# Patient Record
Sex: Male | Born: 1985
Health system: Southern US, Community
[De-identification: ages and names within clinical notes are randomized; demographics above are authoritative.]

## PROBLEM LIST (undated history)

## (undated) DIAGNOSIS — N200 Calculus of kidney: Secondary | ICD-10-CM

## (undated) DIAGNOSIS — B019 Varicella without complication: Secondary | ICD-10-CM

## (undated) DIAGNOSIS — G4733 Obstructive sleep apnea (adult) (pediatric): Secondary | ICD-10-CM

## (undated) DIAGNOSIS — R569 Unspecified convulsions: Secondary | ICD-10-CM

## (undated) HISTORY — PX: TONSILLECTOMY AND ADENOIDECTOMY: SHX28

## (undated) HISTORY — DX: Calculus of kidney: N20.0

## (undated) HISTORY — DX: Varicella without complication: B01.9

## (undated) HISTORY — DX: Obstructive sleep apnea (adult) (pediatric): G47.33

## (undated) HISTORY — PX: VASECTOMY: SHX75

---

## 2016-05-10 ENCOUNTER — Emergency Department
Admission: EM | Admit: 2016-05-10 | Discharge: 2016-05-10 | Disposition: A | Discharge status: Home / Self Care | Payer: Commercial Managed Care - PPO | Attending: Emergency Medicine | Admitting: Emergency Medicine

## 2016-05-10 DIAGNOSIS — S61012A Laceration without foreign body of left thumb without damage to nail, initial encounter: Secondary | ICD-10-CM | POA: Insufficient documentation

## 2016-05-10 DIAGNOSIS — W228XXA Striking against or struck by other objects, initial encounter: Secondary | ICD-10-CM | POA: Diagnosis not present

## 2016-05-10 DIAGNOSIS — Y9259 Other trade areas as the place of occurrence of the external cause: Secondary | ICD-10-CM | POA: Diagnosis not present

## 2016-05-10 DIAGNOSIS — Y999 Unspecified external cause status: Secondary | ICD-10-CM | POA: Diagnosis not present

## 2016-05-10 DIAGNOSIS — Y9389 Activity, other specified: Secondary | ICD-10-CM | POA: Insufficient documentation

## 2016-05-10 DIAGNOSIS — Z23 Encounter for immunization: Secondary | ICD-10-CM | POA: Diagnosis not present

## 2016-05-10 DIAGNOSIS — S61412A Laceration without foreign body of left hand, initial encounter: Secondary | ICD-10-CM

## 2016-05-10 MED ORDER — LIDOCAINE HCL (PF) 1 % IJ SOLN
INTRAMUSCULAR | Status: AC
  Filled 2016-05-10: qty 5

## 2016-05-10 MED ORDER — TETANUS-DIPHTH-ACELL PERTUSSIS 5-2.5-18.5 LF-MCG/0.5 IM SUSP
0.5000 mL | Freq: Once | INTRAMUSCULAR | Status: AC
  Administered 2016-05-10: 0.5 mL via INTRAMUSCULAR

## 2016-05-10 NOTE — ED Provider Notes (Signed)
[facility] Emergency Department Provider Note  ____________________________________________  Time seen: Approximately 7:38 PM  I have reviewed the triage vital signs and the nursing notes.   HISTORY  Chief Complaint Extremity Laceration    HPI Calvin Mcbride is a 31 y.o. male that presents to the emergency with left hand laceration. Patient was working in his garage when piece of metal hit the back of his first knuckle. Patient is not having any difficulty moving fingers or wrist. Patient denies sensation changes. Patient has not taken anything for pain. Patient is not sure when his last tetanus shot was. Patient denies any additional injuries.   No past medical history on file.  There are no active problems to display for this patient.   No past surgical history on file.  Prior to Admission medications   Not on File    Allergies Patient has no known allergies.  No family history on file.  Social History Social History  Substance Use Topics  . Smoking status: Not on file  . Smokeless tobacco: Not on file  . Alcohol use Not on file     Review of Systems  Constitutional: No fever/chills ENT: No upper respiratory complaints. Cardiovascular: No chest pain. Respiratory: No SOB. Gastrointestinal: No abdominal pain.  No nausea, no vomiting.  Musculoskeletal: Negative for musculoskeletal pain. Skin: Negative for ecchymosis. Neurological: Negative for headaches, numbness or tingling   ____________________________________________   PHYSICAL EXAM:  VITAL SIGNS: ED Triage Vitals [05/10/16 1816]  Enc Vitals Group     BP (!) 156/105     Pulse Rate 67     Resp 19     Temp 98 F (36.7 C)     Temp Source Oral     SpO2 99 %     Weight 220 lb (99.8 kg)     Height 5\' 11"  (1.803 m)     Head Circumference      Peak Flow      Pain Score 3     Pain Loc      Pain Edu?      Excl. in GC?      Constitutional: Alert and oriented. Well  appearing and in no acute distress. Eyes: Conjunctivae are normal. PERRL. EOMI. Head: Atraumatic. ENT:      Ears:      Nose: No congestion/rhinnorhea.      Mouth/Throat: Mucous membranes are moist.  Neck: No stridor.   Cardiovascular: Normal rate, regular rhythm. Normal S1 and S2.  Good peripheral circulation. 2+ radial pulses. Respiratory: Normal respiratory effort without tachypnea or retractions. Lungs CTAB. Good air entry to the bases with no decreased or absent breath sounds. Musculoskeletal: Full range of motion to all extremities. No gross deformities appreciated. Neurologic:  Normal speech and language. No gross focal neurologic deficits are appreciated.  Skin:  Skin is warm, dry. No rash noted. 3 cm laceration over first knuckle. Bleeding controlled. Psychiatric: Mood and affect are normal. Speech and behavior are normal. Patient exhibits appropriate insight and judgement.   ____________________________________________   LABS (all labs ordered are listed, but only abnormal results are displayed)  Labs Reviewed - No data to display ____________________________________________  EKG   ____________________________________________  RADIOLOGY No results found.  ____________________________________________    PROCEDURES  Procedure(s) performed:    Procedures  LACERATION REPAIR Performed by: Enid DerryAshley Ashey Tramontana  Consent: Verbal consent obtained.  Consent given by: patient  Prepped and Draped in normal sterile fashion  Wound explored: No foreign bodies  Laceration Location: 1st metacarpal  Laceration Length: 3 cm  Anesthesia: None  Local anesthetic: lidocaine 1% without epinephrine  Anesthetic total: 2 ml  Irrigation method: syringe  Amount of cleaning: normal saline  Skin closure: 4-0 nylon  Number of sutures: 8  Technique: Simple interrupted  Patient tolerance: Patient tolerated the procedure well with no immediate  complications.  Medications  lidocaine (PF) (XYLOCAINE) 1 % injection (not administered)  Tdap (BOOSTRIX) injection 0.5 mL (0.5 mLs Intramuscular Given 05/10/16 1950)     ____________________________________________   INITIAL IMPRESSION / ASSESSMENT AND PLAN / ED COURSE  Pertinent labs & imaging results that were available during my care of the patient were reviewed by me and considered in my medical decision making (see chart for details).  Review of the [place] CSRS was performed in accordance of the [facility] prior to dispensing any controlled drugs.  Clinical Course     Patient's diagnosis is consistent with laceration. Laceration was repaired with 8 stitches. Tetanus shot was updated. Patient is to follow up with PCP as directed. Patient is given ED precautions to return to the ED for any worsening or new symptoms.     ____________________________________________  FINAL CLINICAL IMPRESSION(S) / ED DIAGNOSES  Final diagnoses:  Laceration of left hand without foreign body, initial encounter      NEW MEDICATIONS STARTED DURING THIS VISIT:  New Prescriptions   No medications on file        This chart was dictated using voice recognition software/Dragon. Despite best efforts to proofread, errors can occur which can change the meaning. Any change was purely unintentional.    Enid Derry, PA-C 05/10/16 2028    Minna Antis, MD 05/10/16 2300

## 2016-05-10 NOTE — ED Triage Notes (Signed)
Pt reports laceration to left hand on piece of sheet metal about 45 min PTA; bleeding controlled at this time. Pt unsure of last tetanus.

## 2016-05-10 NOTE — ED Notes (Signed)
See triage note   States he was working on [employer], [employer]age door    Piece of metal shot back  Large flap laceration noted to top of left hand

## 2016-11-07 ENCOUNTER — Telehealth: Payer: Self-pay | Admitting: Family

## 2016-11-07 ENCOUNTER — Ambulatory Visit (INDEPENDENT_AMBULATORY_CARE_PROVIDER_SITE_OTHER): Payer: Commercial Managed Care - PPO | Admitting: Family

## 2016-11-07 ENCOUNTER — Other Ambulatory Visit: Payer: Self-pay | Admitting: Family

## 2016-11-07 ENCOUNTER — Encounter: Payer: Self-pay | Admitting: Family

## 2016-11-07 VITALS — BP 136/88 | HR 68 | Temp 98.2°F | Ht 71.0 in | Wt 229.8 lb

## 2016-11-07 DIAGNOSIS — Z0000 Encounter for general adult medical examination without abnormal findings: Secondary | ICD-10-CM

## 2016-11-07 DIAGNOSIS — L989 Disorder of the skin and subcutaneous tissue, unspecified: Secondary | ICD-10-CM | POA: Diagnosis not present

## 2016-11-07 DIAGNOSIS — R0683 Snoring: Secondary | ICD-10-CM | POA: Diagnosis not present

## 2016-11-07 DIAGNOSIS — Z0001 Encounter for general adult medical examination with abnormal findings: Secondary | ICD-10-CM

## 2016-11-07 DIAGNOSIS — J301 Allergic rhinitis due to pollen: Secondary | ICD-10-CM | POA: Diagnosis not present

## 2016-11-07 LAB — COMPREHENSIVE METABOLIC PANEL
ALT: 31 U/L (ref 0–53)
AST: 19 U/L (ref 0–37)
Albumin: 4.6 g/dL (ref 3.5–5.2)
Alkaline Phosphatase: 54 U/L (ref 39–117)
BUN: 20 mg/dL (ref 6–23)
CO2: 28 mEq/L (ref 19–32)
Calcium: 9.9 mg/dL (ref 8.4–10.5)
Chloride: 103 mEq/L (ref 96–112)
Creatinine, Ser: 0.96 mg/dL (ref 0.40–1.50)
GFR: 95.91 mL/min (ref >60.00)
Glucose, Bld: 100 mg/dL — ABNORMAL HIGH (ref 70–99)
Potassium: 4.2 mEq/L (ref 3.5–5.1)
Sodium: 141 mEq/L (ref 135–145)
Total Bilirubin: 0.4 mg/dL (ref 0.2–1.2)
Total Protein: 7.8 g/dL (ref 6.0–8.3)

## 2016-11-07 LAB — CBC WITH DIFFERENTIAL/PLATELET
Basophils Absolute: 0.1 10*3/uL (ref 0.0–0.1)
Basophils Relative: 0.9 % (ref 0.0–3.0)
Eosinophils Absolute: 0.5 10*3/uL (ref 0.0–0.7)
Eosinophils Relative: 6 % — ABNORMAL HIGH (ref 0.0–5.0)
HCT: 46.2 % (ref 39.0–52.0)
Hemoglobin: 15.7 g/dL (ref 13.0–17.0)
Lymphocytes Relative: 33.1 % (ref 12.0–46.0)
Lymphs Abs: 2.7 10*3/uL (ref 0.7–4.0)
MCHC: 33.9 g/dL (ref 30.0–36.0)
MCV: 85.8 fl (ref 78.0–100.0)
Monocytes Absolute: 0.8 10*3/uL (ref 0.1–1.0)
Monocytes Relative: 10.3 % (ref 3.0–12.0)
Neutro Abs: 4.1 10*3/uL (ref 1.4–7.7)
Neutrophils Relative %: 49.7 % (ref 43.0–77.0)
Platelets: 269 10*3/uL (ref 150.0–400.0)
RBC: 5.39 Mil/uL (ref 4.22–5.81)
RDW: 14.1 % (ref 11.5–15.5)
WBC: 8.2 10*3/uL (ref 4.0–10.5)

## 2016-11-07 LAB — LIPID PANEL
Cholesterol: 189 mg/dL (ref 0–200)
HDL: 40.1 mg/dL (ref >39.00)
LDL Cholesterol: 122 mg/dL — ABNORMAL HIGH (ref 0–99)
NonHDL: 148.42
Total CHOL/HDL Ratio: 5
Triglycerides: 130 mg/dL (ref 0.0–149.0)
VLDL: 26 mg/dL (ref 0.0–40.0)

## 2016-11-07 LAB — TSH: TSH: 2.53 u[IU]/mL (ref 0.35–4.50)

## 2016-11-07 LAB — VITAMIN D 25 HYDROXY (VIT D DEFICIENCY, FRACTURES): VITD: 27.93 ng/mL — ABNORMAL LOW (ref 30.00–100.00)

## 2016-11-07 LAB — HEMOGLOBIN A1C: Hgb A1c MFr Bld: 5.5 % (ref 4.6–6.5)

## 2016-11-07 MED ORDER — FLUTICASONE PROPIONATE 50 MCG/ACT NA SUSP: 2.0000 | Freq: Every day | NASAL | 3 refills | Status: DC

## 2016-11-07 NOTE — Telephone Encounter (Signed)
close

## 2016-11-07 NOTE — Progress Notes (Signed)
Pre visit review using our clinic review tool, if applicable. No additional management support is needed unless otherwise documented below in the visit note. 

## 2016-11-07 NOTE — Assessment & Plan Note (Signed)
Up-to-date on immunizations. Testicular exam performed today. No early family history colon cancer. Screening labs ordered including HIV consented for. Encouraged exercise.

## 2016-11-07 NOTE — Progress Notes (Signed)
Subjective:    Patient ID: Calvin Mcbride, male    DOB: 10/07/1985, 31 y.o.   MRN: [id number]  CC: Calvin Mcbride is a 31 y.o. male who presents today to establish care.    HPI: Would like to establish care today and fasting labs for work to be completed for health screen.   Spots on penis, 1.5 year half. Not changing, growing. No painful.  No new sexual partners. No urinary problems, hesitancy.   Also complains of chronic allergies and post nasal drip since [place]. Has tried over-the-counter antihistamines with no consistent relief. No fever, Nasal congestion  Reports that he snores and has concerns for sleep apnea. No fatigue  Non smoker.   Occasional alcohol.   No formal exercise.   Skin: follows with dermatology, seen last year.   Tdap UTD                   HISTORY:  Past Medical History:  Diagnosis Date  . Chicken pox   . Kidney stones    Past Surgical History:  Procedure Laterality Date  . TONSILLECTOMY AND ADENOIDECTOMY     Family History  Problem Relation Age of Onset  . Colon cancer Neg Hx   . Prostate cancer Neg Hx     Allergies: Patient has no known allergies. No current outpatient prescriptions on file prior to visit.   No current facility-administered medications on file prior to visit.     Social History  Substance Use Topics  . Smoking status: Never Smoker  . Smokeless tobacco: Never Used  . Alcohol use Yes     Comment: Occasional    Review of Systems  Constitutional: Negative for chills and fever.  HENT: Negative for congestion.   Respiratory: Negative for cough.   Cardiovascular: Negative for chest pain, palpitations and leg swelling.  Gastrointestinal: Negative for diarrhea, nausea and vomiting.  Genitourinary: Positive for genital sores. Negative for difficulty urinating, discharge, dysuria, penile pain, penile swelling, scrotal swelling and testicular pain.  Musculoskeletal: Negative for  myalgias.  Skin: Negative for rash.  Neurological: Negative for headaches.  Hematological: Negative for adenopathy.  Psychiatric/Behavioral: Negative for confusion.      Objective:    BP 136/88   Pulse 68   Temp 98.2 F (36.8 C) (Oral)   Ht 5\' 11"  (1.803 m)   Wt 229 lb 12.8 oz (104.2 kg)   SpO2 98%   BMI 32.05 kg/m  BP Readings from Last 3 Encounters:  11/07/16 136/88  05/10/16 139/83   Wt Readings from Last 3 Encounters:  11/07/16 229 lb 12.8 oz (104.2 kg)  05/10/16 220 lb (99.8 kg)    Physical Exam  Constitutional: He appears well-developed and well-nourished.  Neck: No thyroid mass and no thyromegaly present.  Cardiovascular: Regular rhythm and normal heart sounds.   Pulmonary/Chest: Effort normal and breath sounds normal. No respiratory distress. He has no wheezes. He has no rhonchi. He has no rales.  Genitourinary: Testes normal and penis normal. Cremasteric reflex is present. Right testis shows no mass, no swelling and no tenderness. Left testis shows no mass, no swelling and no tenderness. Circumcised.  Genitourinary Comments: Multiple flat dome shaped lesions noted as on diagram, hyperpigmented  Lymphadenopathy:       Head (right side): No submental, no submandibular, no tonsillar, no preauricular, no posterior auricular and no occipital adenopathy present.       Head (left side): No submental, no submandibular, no tonsillar, no preauricular, no  posterior auricular and no occipital adenopathy present.    He has no cervical adenopathy.    He has no axillary adenopathy.  Neurological: He is alert.  Skin: Skin is warm and dry.  Psychiatric: He has a normal mood and affect. His speech is normal and behavior is normal.  Vitals reviewed.      Assessment & Plan:   Problem List Items Addressed This Visit      Respiratory   Allergic rhinitis due to pollen    Chronic. Advised Flonase, nasal saline wash. Offered allergy referral and patient politely declined today.  Return precautions given.      Relevant Medications   fluticasone (FLONASE) 50 MCG/ACT nasal spray     Musculoskeletal and Integument   Skin lesion    Etiology nonspecific at this time. Considering condylomata acuminata however this should be differentiated from cancer and formal diagnosis made which I explained to patient. recommended make an appointment with dermatology  for biopsy. Pending lab studies for HSV.       Relevant Orders   RPR   HSV(herpes simplex vrs) 1+2 ab-IgM     Other   Routine physical examination - Primary    Up-to-date on immunizations. Testicular exam performed today. No early family history colon cancer. Screening labs ordered including HIV consented for. Encouraged exercise.      Relevant Orders   CBC with Differential/Platelet   Comprehensive metabolic panel   Lipid panel   Hemoglobin A1c   VITAMIN D 25 Hydroxy (Vit-D Deficiency, Fractures)   TSH   HIV antibody (with reflex)   Snores    Sleep study ordered.      Relevant Orders   Ambulatory referral to Sleep Studies       I am having Mr. Roam start on fluticasone.   Meds ordered this encounter  Medications  . fluticasone (FLONASE) 50 MCG/ACT nasal spray    Sig: Place 2 sprays into both nostrils daily.    Dispense:  16 g    Refill:  3    Order Specific Question:   Supervising Provider    Answer:   Sherlene Shams [[id number]]    Return precautions given.   Risks, benefits, and alternatives of the medications and treatment plan prescribed today were discussed, and patient expressed understanding.   Education regarding symptom management and diagnosis given to patient on AVS.  Continue to follow with Allegra Grana, FNP for routine health maintenance.   Calvin Horns and I agreed with plan.   Rennie Plowman, FNP

## 2016-11-07 NOTE — Assessment & Plan Note (Signed)
Etiology nonspecific at this time. Considering condylomata acuminata however this should be differentiated from cancer and formal diagnosis made which I explained to patient. recommended make an appointment with dermatology  for biopsy. Pending lab studies for HSV.

## 2016-11-07 NOTE — Assessment & Plan Note (Addendum)
Snores. Elevated blood pressure. Sleep study ordered.

## 2016-11-07 NOTE — Assessment & Plan Note (Signed)
Chronic. Advised Flonase, nasal saline wash. Offered allergy referral and patient politely declined today. Return precautions given.

## 2016-11-07 NOTE — Patient Instructions (Addendum)
Pleasure meeting you  Labs today  Let me know if flonase doesn't help  Lesions on penis, I would recommend make an appointment with dermatology sig need biopsy. It may be condylomata acuminata ( genital warts) however this should be differentiated and formal diagnosis made.     Health Maintenance, Male A healthy lifestyle and preventive care is important for your health and wellness. Ask your health care provider about what schedule of regular examinations is right for you. What should I know about weight and diet? Eat a Healthy Diet  Eat plenty of vegetables, fruits, whole grains, low-fat dairy products, and lean protein.  Do not eat a lot of foods high in solid fats, added sugars, or salt.  Maintain a Healthy Weight Regular exercise can help you achieve or maintain a healthy weight. You should:  Do at least 150 minutes of exercise each week. The exercise should increase your heart rate and make you sweat (moderate-intensity exercise).  Do strength-training exercises at least twice a week.  Watch Your Levels of Cholesterol and Blood Lipids  Have your blood tested for lipids and cholesterol every 5 years starting at 33 years of age. If you are at high risk for heart disease, you should start having your blood tested when you are 33 years old. You may need to have your cholesterol levels checked more often if: ? Your lipid or cholesterol levels are high. ? You are older than 33 years of age. ? You are at high risk for heart disease.  What should I know about cancer screening? Many types of cancers can be detected early and may often be prevented. Lung Cancer  You should be screened every year for lung cancer if: ? You are a current smoker who has smoked for at least 30 years. ? You are a former smoker who has quit within the past 15 years.  Talk to your health care provider about your screening options, when you should start screening, and how often you should be  screened.  Colorectal Cancer  Routine colorectal cancer screening usually begins at 33 years of age and should be repeated every 5-10 years until you are 33 years old. You may need to be screened more often if early forms of precancerous polyps or small growths are found. Your health care provider may recommend screening at an earlier age if you have risk factors for colon cancer.  Your health care provider may recommend using home test kits to check for hidden blood in the stool.  A small camera at the end of a tube can be used to examine your colon (sigmoidoscopy or colonoscopy). This checks for the earliest forms of colorectal cancer.  Prostate and Testicular Cancer  Depending on your age and overall health, your health care provider may do certain tests to screen for prostate and testicular cancer.  Talk to your health care provider about any symptoms or concerns you have about testicular or prostate cancer.  Skin Cancer  Check your skin from head to toe regularly.  Tell your health care provider about any new moles or changes in moles, especially if: ? There is a change in a mole's size, shape, or color. ? You have a mole that is larger than a pencil eraser.  Always use sunscreen. Apply sunscreen liberally and repeat throughout the day.  Protect yourself by wearing long sleeves, pants, a wide-brimmed hat, and sunglasses when outside.  What should I know about heart disease, diabetes, and high blood pressure?  If you are 74-45 years of age, have your blood pressure checked every 3-5 years. If you are 54 years of age or older, have your blood pressure checked every year. You should have your blood pressure measured twice-once when you are at a hospital or clinic, and once when you are not at a hospital or clinic. Record the average of the two measurements. To check your blood pressure when you are not at a hospital or clinic, you can use: ? An automated blood pressure machine at a  pharmacy. ? A home blood pressure monitor.  Talk to your health care provider about your target blood pressure.  If you are between 21-45 years old, ask your health care provider if you should take aspirin to prevent heart disease.  Have regular diabetes screenings by checking your fasting blood sugar level. ? If you are at a normal weight and have a low risk for diabetes, have this test once every three years after the age of 31. ? If you are overweight and have a high risk for diabetes, consider being tested at a younger age or more often.  A one-time screening for abdominal aortic aneurysm (AAA) by ultrasound is recommended for men aged 36-75 years who are current or former smokers. What should I know about preventing infection? Hepatitis B If you have a higher risk for hepatitis B, you should be screened for this virus. Talk with your health care provider to find out if you are at risk for hepatitis B infection. Hepatitis C Blood testing is recommended for:  Everyone born from [birth year] through 1967.  Anyone with known risk factors for hepatitis C.  Sexually Transmitted Diseases (STDs)  You should be screened each year for STDs including gonorrhea and chlamydia if: ? You are sexually active and are younger than 33 years of age. ? You are older than 33 years of age and your health care provider tells you that you are at risk for this type of infection. ? Your sexual activity has changed since you were last screened and you are at an increased risk for chlamydia or gonorrhea. Ask your health care provider if you are at risk.  Talk with your health care provider about whether you are at high risk of being infected with HIV. Your health care provider may recommend a prescription medicine to help prevent HIV infection.  What else can I do?  Schedule regular health, dental, and eye exams.  Stay current with your vaccines (immunizations).  Do not use any tobacco products, such as  cigarettes, chewing tobacco, and e-cigarettes. If you need help quitting, ask your health care provider.  Limit alcohol intake to no more than 2 drinks per day. One drink equals 12 ounces of beer, 5 ounces of wine, or 1 ounces of hard liquor.  Do not use street drugs.  Do not share needles.  Ask your health care provider for help if you need support or information about quitting drugs.  Tell your health care provider if you often feel depressed.  Tell your health care provider if you have ever been abused or do not feel safe at home. This information is not intended to replace advice given to you by your health care provider. Make sure you discuss any questions you have with your health care provider. Document Released: 10/08/2007 Document Revised: 12/09/2015 Document Reviewed: 01/13/2015 Elsevier Interactive Patient Education  [employer].

## 2016-11-08 LAB — RPR

## 2016-11-08 LAB — HIV ANTIBODY (ROUTINE TESTING W REFLEX): HIV 1&2 Ab, 4th Generation: NONREACTIVE

## 2016-11-14 LAB — HSV 1/2 AB (IGM), IFA W/RFLX TITER
HSV 1 IgM Screen: NEGATIVE
HSV 1 IgM Screen: NEGATIVE
HSV 2 IgM Screen: NEGATIVE
HSV 2 IgM Screen: NEGATIVE

## 2016-12-06 ENCOUNTER — Ambulatory Visit: Payer: Commercial Managed Care - PPO | Attending: Family

## 2016-12-06 DIAGNOSIS — G4733 Obstructive sleep apnea (adult) (pediatric): Secondary | ICD-10-CM | POA: Diagnosis not present

## 2016-12-06 DIAGNOSIS — R0683 Snoring: Secondary | ICD-10-CM | POA: Diagnosis present

## 2017-01-12 ENCOUNTER — Ambulatory Visit: Payer: Commercial Managed Care - PPO | Attending: Internal Medicine

## 2017-01-12 DIAGNOSIS — G4733 Obstructive sleep apnea (adult) (pediatric): Secondary | ICD-10-CM | POA: Diagnosis not present

## 2017-03-20 ENCOUNTER — Telehealth: Payer: Self-pay

## 2017-03-20 NOTE — Telephone Encounter (Signed)
Left voice mail to call back, HIPPA complaint to confirm .  

## 2017-03-20 NOTE — Telephone Encounter (Signed)
-----   Message from Bevelyn Bucklesracy N McLean-Scocuzza, MD sent at 03/20/2017  5:32 PM EST ----- Please call patient and advise he needs to turn chip from machine cpap into Sleep Med therapies before 05/2017 and also have an appt with our clinic   Thanks TMS

## 2017-03-21 NOTE — Telephone Encounter (Signed)
Patient said he was never advised that he needed a CPAP and never received a CPAP, sleep study was never reviewed with patient.

## 2017-03-21 NOTE — Telephone Encounter (Signed)
See below message , Do I just need to schedule him for follow up appointment to review sleep study results and go over CPAP titration results?

## 2017-03-22 NOTE — Telephone Encounter (Signed)
What did the company say about the letter they sent. What are they needing from us? Do they have proof he got the machine?  If not then I guess he needs an appt to discuss results and coordinate a machine in the future  Results should be in media tab   Thanks TSM

## 2017-03-28 NOTE — Telephone Encounter (Addendum)
Spoke with patient states he never heard back from our office in regards to results of sleep study done 12/06/16  Results were faxed in 12/2016 .  I [facility] with  [facility]  and spoke with Lillia AbedLindsay patient was contacted on 03/06/17 and left voicemail in regards to equipment. Elon Jester.Michele spoke with patient today and patient doesn't feel he needs CPAP machine due to he doesn't feel that he will benefit from it.   I scheduled patient a follow up appointment for 04/03/17 for you to go over sleep study results with patient .   I thought Olegario MessierKathy was handling message last week however it was still left in my box.

## 2017-04-03 ENCOUNTER — Ambulatory Visit: Payer: Commercial Managed Care - PPO | Admitting: Internal Medicine

## 2017-04-14 ENCOUNTER — Encounter: Payer: Self-pay | Admitting: Internal Medicine

## 2017-04-14 ENCOUNTER — Ambulatory Visit: Payer: Commercial Managed Care - PPO | Admitting: Internal Medicine

## 2017-04-14 VITALS — BP 124/84 | HR 79 | Temp 98.4°F | Ht 71.0 in | Wt 237.1 lb

## 2017-04-14 DIAGNOSIS — G4733 Obstructive sleep apnea (adult) (pediatric): Secondary | ICD-10-CM

## 2017-04-14 DIAGNOSIS — M950 Acquired deformity of nose: Secondary | ICD-10-CM

## 2017-04-14 NOTE — Progress Notes (Signed)
Chief Complaint  Patient presents with  . Follow-up    disc sleep study    OSA f/u  1. Pt had sleep study 8/9 2018 with dx of mild sleep apnea but he was not informed by our clinic until phone calls/#s from [facility] and [place] [phone], [place] tried to call him to pick up cpap machine with settings of 7 cm H20 at titration study 01/12/17. He has not done this yet b/c with CPAP titration study he felt groggy the next day and was uncomfortable with all the equipment and did not like the way it made him feel. He is going to try to lose weight to see if this helps sleep apnea. He does report overbite and has tried mouthpieces with dentist before which did not help snoring. He reports his tonsils were removed for stones but unsure of adenoids and this happened in [place]. 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 He is undecided if he wants to pick up CPAP machine from [facility] due to how if made him feel. Disc case with ENT Dr. Richardson Landry who read study who advised with consulting on the patient himself he could not offer much advise other than trial of CPAP at that setting, wt loss, vs disc with dental. Informed pt and he will disc with wife.    Review of Systems  HENT:       +left nasal deformity  Respiratory: Negative for shortness of breath.   Cardiovascular: Negative for chest pain.  Gastrointestinal: Negative for abdominal pain.  Skin: Negative for rash.       +cyst to right forearm    Past Medical History:  Diagnosis Date  . Chicken pox   . Kidney stones   . OSA (obstructive sleep apnea)    per sleep study 8 or 12/2016 Sleep Med    Past Surgical History:  Procedure Laterality Date  . TONSILLECTOMY AND ADENOIDECTOMY     Family History  Problem Relation Age of Onset  . Colon cancer Neg Hx   . Prostate cancer Neg Hx    Social History   Socioeconomic History  . Marital status: Married    Spouse name: Not on file  . Number of children: Not on file  . Years of  education: Not on file  . Highest education level: Not on file  Social Needs  . Financial resource strain: Not on file  . Food insecurity - worry: Not on file  . Food insecurity - inability: Not on file  . Transportation needs - medical: Not on file  . Transportation needs - non-medical: Not on file  Occupational History  . Not on file  Tobacco Use  . Smoking status: Never Smoker  . Smokeless tobacco: Never Used  Substance and Sexual Activity  . Alcohol use: Yes    Comment: Occasional  . Drug use: No  . Sexual activity: Yes  Other Topics Concern  . Not on file  Social History Narrative   Works for [employer]      Married      No outpatient medications have been marked as taking for the 04/14/17 encounter (Office Visit) with McLean-Scocuzza, Nino Glow, MD.   No Known Allergies No results found for this or any previous visit (from the past 2160 hour(s)). Objective  Body mass index is 33.07 kg/m. Wt Readings from Last 3 Encounters:  04/14/17 237 lb 2 oz (107.6 kg)  11/07/16 229 lb 12.8 oz (104.2  kg)  05/10/16 220 lb (99.8 kg)   Temp Readings from Last 3 Encounters:  04/14/17 98.4 F (36.9 C) (Oral)  11/07/16 98.2 F (36.8 C) (Oral)  05/10/16 98.4 F (36.9 C) (Oral)   BP Readings from Last 3 Encounters:  04/14/17 124/84  11/07/16 136/88  05/10/16 139/83   Pulse Readings from Last 3 Encounters:  04/14/17 79  11/07/16 68  05/10/16 72   O2 room air 97% Physical Exam  Constitutional: He is oriented to person, place, and time and well-developed, well-nourished, and in no distress. Vital signs are normal.  HENT:  Head: Normocephalic and atraumatic.  Mouth/Throat: Oropharynx is clear and moist and mucous membranes are normal.    Eyes: Conjunctivae are normal. Pupils are equal, round, and reactive to light.  Cardiovascular: Normal rate, regular rhythm and normal heart sounds.  Pulmonary/Chest: Effort normal and breath sounds normal.  Neurological: He is alert and  oriented to person, place, and time. Gait normal. Gait normal.  Skin: Skin is warm and dry.     Psychiatric: Mood, memory, affect and judgment normal.  Nursing note and vitals reviewed.   Assessment   1. Mild osa dx'ed 11/2016 sleep study  2. Nasal deformity  3. HM Plan  1.  Ed pt dx of OSA and try wt loss, disc with dentist about mouth pieces   He is hesistant to use CPAP b/c he felt groggy after use for cpap titration study and has not picked up machine  After disc with ENT he agrees with above and rec trial of CPAP at setting of 7 b/c adjustments can be made and cant comment much due to only reading the study and not having met the pt   Pt will disc with wife and get back with clinic  2. Disc referral to ENT in future to eval  3. Declines flu shot  UTD Tdap  Consider check hep B immunity in future.    Provider: Dr. Olivia Mackie McLean-Scocuzza-Internal Medicine

## 2017-04-14 NOTE — Patient Instructions (Addendum)
Ill be in touch about your sleep study  F/u in 7-8 months sooner if needed   Sleep Apnea Sleep apnea is a condition in which breathing pauses or becomes shallow during sleep. Episodes of sleep apnea usually last 10 seconds or longer, and they may occur as many as 20 times an hour. Sleep apnea disrupts your sleep and keeps your body from getting the rest that it needs. This condition can increase your risk of certain health problems, including:  Heart attack.  Stroke.  Obesity.  Diabetes.  Heart failure.  Irregular heartbeat.  There are three kinds of sleep apnea:  Obstructive sleep apnea. This kind is caused by a blocked or collapsed airway.  Central sleep apnea. This kind happens when the part of the brain that controls breathing does not send the correct signals to the muscles that control breathing.  Mixed sleep apnea. This is a combination of obstructive and central sleep apnea.  What are the causes? The most common cause of this condition is a collapsed or blocked airway. An airway can collapse or become blocked if:  Your throat muscles are abnormally relaxed.  Your tongue and tonsils are larger than normal.  You are overweight.  Your airway is smaller than normal.  What increases the risk? This condition is more likely to develop in people who:  Are overweight.  Smoke.  Have a smaller than normal airway.  Are elderly.  Are male.  Drink alcohol.  Take sedatives or tranquilizers.  Have a family history of sleep apnea.  What are the signs or symptoms? Symptoms of this condition include:  Trouble staying asleep.  Daytime sleepiness and tiredness.  Irritability.  Loud snoring.  Morning headaches.  Trouble concentrating.  Forgetfulness.  Decreased interest in sex.  Unexplained sleepiness.  Mood swings.  Personality changes.  Feelings of depression.  Waking up often during the night to urinate.  Dry mouth.  Sore throat.  How is  this diagnosed? This condition may be diagnosed with:  A medical history.  A physical exam.  A series of tests that are done while you are sleeping (sleep study). These tests are usually done in a sleep lab, but they may also be done at home.  How is this treated? Treatment for this condition aims to restore normal breathing and to ease symptoms during sleep. It may involve managing health issues that can affect breathing, such as high blood pressure or obesity. Treatment may include:  Sleeping on your side.  Using a decongestant if you have nasal congestion.  Avoiding the use of depressants, including alcohol, sedatives, and narcotics.  Losing weight if you are overweight.  Making changes to your diet.  Quitting smoking.  Using a device to open your airway while you sleep, such as: ? An oral appliance. This is a custom-made mouthpiece that shifts your lower jaw forward. ? A continuous positive airway pressure (CPAP) device. This device delivers oxygen to your airway through a mask. ? A nasal expiratory positive airway pressure (EPAP) device. This device has valves that you put into each nostril. ? A bi-level positive airway pressure (BPAP) device. This device delivers oxygen to your airway through a mask.  Surgery if other treatments do not work. During surgery, excess tissue is removed to create a wider airway.  It is important to get treatment for sleep apnea. Without treatment, this condition can lead to:  High blood pressure.  Coronary artery disease.  (Men) An inability to achieve or maintain an erection (impotence).  Reduced thinking abilities.  Follow these instructions at home:  Make any lifestyle changes that your health care provider recommends.  Eat a healthy, well-balanced diet.  Take over-the-counter and prescription medicines only as told by your health care provider.  Avoid using depressants, including alcohol, sedatives, and narcotics.  Take steps  to lose weight if you are overweight.  If you were given a device to open your airway while you sleep, use it only as told by your health care provider.  Do not use any tobacco products, such as cigarettes, chewing tobacco, and e-cigarettes. If you need help quitting, ask your health care provider.  Keep all follow-up visits as told by your health care provider. This is important. Contact a health care provider if:  The device that you received to open your airway during sleep is uncomfortable or does not seem to be working.  Your symptoms do not improve.  Your symptoms get worse. Get help right away if:  You develop chest pain.  You develop shortness of breath.  You develop discomfort in your back, arms, or stomach.  You have trouble speaking.  You have weakness on one side of your body.  You have drooping in your face. These symptoms may represent a serious problem that is an emergency. Do not wait to see if the symptoms will go away. Get medical help right away. Call your local emergency services (911 in the [place] Do not drive yourself to the hospital. This information is not intended to replace advice given to you by your health care provider. Make sure you discuss any questions you have with your health care provider. Document Released: 04/01/2002 Document Revised: 12/06/2015 Document Reviewed: 01/19/2015 Elsevier Interactive Patient Education  [employer]2018 Elsevier Inc.

## 2018-03-14 ENCOUNTER — Ambulatory Visit
Admission: RE | Admit: 2018-03-14 | Discharge: 2018-03-14 | Disposition: A | Discharge status: Home / Self Care | Payer: Commercial Managed Care - PPO | Source: Ambulatory Visit | Attending: Family Medicine | Admitting: Family Medicine

## 2018-03-14 ENCOUNTER — Ambulatory Visit: Payer: Commercial Managed Care - PPO | Admitting: Family Medicine

## 2018-03-14 ENCOUNTER — Encounter: Payer: Self-pay | Admitting: Family Medicine

## 2018-03-14 VITALS — BP 120/82 | HR 58 | Temp 97.5°F | Ht 71.0 in | Wt 230.0 lb

## 2018-03-14 DIAGNOSIS — N200 Calculus of kidney: Secondary | ICD-10-CM

## 2018-03-14 DIAGNOSIS — K760 Fatty (change of) liver, not elsewhere classified: Secondary | ICD-10-CM | POA: Insufficient documentation

## 2018-03-14 DIAGNOSIS — R1031 Right lower quadrant pain: Secondary | ICD-10-CM | POA: Insufficient documentation

## 2018-03-14 DIAGNOSIS — N132 Hydronephrosis with renal and ureteral calculous obstruction: Secondary | ICD-10-CM | POA: Insufficient documentation

## 2018-03-14 DIAGNOSIS — Z87442 Personal history of urinary calculi: Secondary | ICD-10-CM

## 2018-03-14 DIAGNOSIS — R110 Nausea: Secondary | ICD-10-CM | POA: Diagnosis not present

## 2018-03-14 DIAGNOSIS — R109 Unspecified abdominal pain: Secondary | ICD-10-CM

## 2018-03-14 LAB — COMPREHENSIVE METABOLIC PANEL
ALT: 37 U/L (ref 0–53)
AST: 24 U/L (ref 0–37)
Albumin: 4.8 g/dL (ref 3.5–5.2)
Alkaline Phosphatase: 53 U/L (ref 39–117)
BUN: 18 mg/dL (ref 6–23)
CO2: 29 mEq/L (ref 19–32)
Calcium: 9.8 mg/dL (ref 8.4–10.5)
Chloride: 100 mEq/L (ref 96–112)
Creatinine, Ser: 1.19 mg/dL (ref 0.40–1.50)
GFR: 74.25 mL/min (ref >60.00)
Glucose, Bld: 146 mg/dL — ABNORMAL HIGH (ref 70–99)
Potassium: 3.9 mEq/L (ref 3.5–5.1)
Sodium: 138 mEq/L (ref 135–145)
Total Bilirubin: 0.5 mg/dL (ref 0.2–1.2)
Total Protein: 7.8 g/dL (ref 6.0–8.3)

## 2018-03-14 LAB — CBC WITH DIFFERENTIAL/PLATELET
Basophils Absolute: 0.1 10*3/uL (ref 0.0–0.1)
Basophils Relative: 0.6 % (ref 0.0–3.0)
Eosinophils Absolute: 0.3 10*3/uL (ref 0.0–0.7)
Eosinophils Relative: 2.4 % (ref 0.0–5.0)
HCT: 47.5 % (ref 39.0–52.0)
Hemoglobin: 16 g/dL (ref 13.0–17.0)
Lymphocytes Relative: 24.3 % (ref 12.0–46.0)
Lymphs Abs: 2.5 10*3/uL (ref 0.7–4.0)
MCHC: 33.8 g/dL (ref 30.0–36.0)
MCV: 87.1 fl (ref 78.0–100.0)
Monocytes Absolute: 0.8 10*3/uL (ref 0.1–1.0)
Monocytes Relative: 8.2 % (ref 3.0–12.0)
Neutro Abs: 6.7 10*3/uL (ref 1.4–7.7)
Neutrophils Relative %: 64.5 % (ref 43.0–77.0)
Platelets: 275 10*3/uL (ref 150.0–400.0)
RBC: 5.45 Mil/uL (ref 4.22–5.81)
RDW: 14.2 % (ref 11.5–15.5)
WBC: 10.3 10*3/uL (ref 4.0–10.5)

## 2018-03-14 MED ORDER — PROMETHAZINE HCL 25 MG/ML IJ SOLN
25.0000 mg | Freq: Once | INTRAMUSCULAR | Status: AC
  Administered 2018-03-14: 25 mg via INTRAMUSCULAR

## 2018-03-14 MED ORDER — CIPROFLOXACIN HCL 500 MG PO TABS: 500.0000 mg | ORAL_TABLET | Freq: Two times a day (BID) | ORAL | 0 refills | Status: DC

## 2018-03-14 MED ORDER — KETOROLAC TROMETHAMINE 60 MG/2ML IM SOLN
60.0000 mg | Freq: Once | INTRAMUSCULAR | Status: AC
  Administered 2018-03-14: 60 mg via INTRAMUSCULAR

## 2018-03-14 MED ORDER — ACETAMINOPHEN-CODEINE #3 300-30 MG PO TABS: 1.0000 | ORAL_TABLET | Freq: Four times a day (QID) | ORAL | 0 refills | Status: DC | PRN

## 2018-03-14 MED ORDER — TAMSULOSIN HCL 0.4 MG PO CAPS: 0.4000 mg | ORAL_CAPSULE | Freq: Every day | ORAL | 0 refills | Status: DC

## 2018-03-14 NOTE — Progress Notes (Signed)
Subjective:    Patient ID: Calvin Mcbride, male    DOB: 03/12/1986, 32 y.o.   MRN: [id number]  HPI   Presents to clinic c/o nausea, dizziness, low back pain (right) for 1 days.  Patient has a personal history of kidney stone, states his pain while feels similar to kidney stone in the past.  Patient denies any pain, denies vomiting or diarrhea.  No fever or chills.  Patient states his father also has a history of kidney stones.  Patient Active Problem List   Diagnosis Date Noted  . OSA (obstructive sleep apnea) 04/14/2017  . 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 Routine physical examination 11/07/2016  . Allergic rhinitis due to pollen 11/07/2016  . Skin lesion 11/07/2016   Social History   Tobacco Use  . Smoking status: Never Smoker  . Smokeless tobacco: Never Used  Substance Use Topics  . Alcohol use: Yes    Comment: Occasional   Review of Systems  Constitutional: Negative for chills, fatigue and fever.  HENT: Negative for congestion, ear pain, sinus pain and sore throat.   Eyes: Negative.   Respiratory: Negative for cough, shortness of breath and wheezing.   Cardiovascular: Negative for chest pain, palpitations and leg swelling.  Gastrointestinal: +nausea Genitourinary: Negative for dysuria, frequency and urgency.  Musculoskeletal: +low back pain, R Skin: Negative for color change, pallor and rash.  Neurological: +dizziness Psychiatric/Behavioral: The patient is not nervous/anxious.       Objective:   Physical Exam  Constitutional: He is oriented to person, place, and time.  Appears uncomfortable  HENT:  Head: Normocephalic and atraumatic.  Eyes: Conjunctivae and EOM are normal. No scleral icterus.  Neck: Neck supple. No tracheal deviation present.  Cardiovascular: Normal rate and regular rhythm.  Pulmonary/Chest: Effort normal and breath sounds normal. No respiratory distress.  Abdominal: Soft. There is tenderness (right flank pain, wraps to RLQ).    Musculoskeletal: He exhibits no edema.  Neurological: He is alert and oriented to person, place, and time.  Skin:  Mild clamminess  Psychiatric: He has a normal mood and affect. His behavior is normal.  Vitals reviewed.      Vitals:   03/14/18 0935  BP: 120/82  Pulse: (!) 58  Temp: (!) 97.5 F (36.4 C)  SpO2: 97%    Assessment & Plan:   Right flank pain, right lower quadrant abdominal pain, nausea, history of renal stone - patient given IM Phenergan and Toradol in clinic to help improve nausea and pain.  He will go over to the hospital for CT renal study right now.  Due to all patient's symptoms and history of renal stone I do suspect kidney stone discomfort.  Patient was unable to give us a urine sample in clinic today.  Tylenol #3, Cipro 500 mg twice daily and Flomax sent over to pharmacy to cover any development of infection, pain control and also to help with passage of suspected kidney stone.   Administrations This Visit    ketorolac (TORADOL) injection 60 mg    Admin Date 03/14/2018 Action Given Dose 60 mg Route Intramuscular Administered By Clearnce SorrelPickard, Jill S, RMA       promethazine (PHENERGAN) injection 25 mg    Admin Date 03/14/2018 Action Given Dose 25 mg Route Intramuscular Administered By Clearnce SorrelPickard, Jill S, RMA         CBC, CMP drawn in clinic today.  Patient aware he will be contacted with results of his scan.  Next step in our plan of care  will be determined by results of CT scan.

## 2018-03-14 NOTE — Addendum Note (Signed)
Addended by: Leanora CoverGUSE, LAUREN on: 03/14/2018 11:52 AM   Modules accepted: Orders

## 2018-04-25 HISTORY — PX: OTHER SURGICAL HISTORY: SHX169

## 2018-04-30 ENCOUNTER — Ambulatory Visit (INDEPENDENT_AMBULATORY_CARE_PROVIDER_SITE_OTHER): Payer: Commercial Managed Care - PPO | Admitting: Urology

## 2018-04-30 ENCOUNTER — Encounter: Payer: Self-pay | Admitting: Urology

## 2018-04-30 VITALS — BP 144/89 | HR 59 | Ht 71.0 in | Wt 231.1 lb

## 2018-04-30 DIAGNOSIS — N200 Calculus of kidney: Secondary | ICD-10-CM

## 2018-04-30 NOTE — Patient Instructions (Signed)
Dietary Guidelines to Help Prevent Kidney Stones  Kidney stones are deposits of minerals and salts that form inside your kidneys. Your risk of developing kidney stones may be greater depending on your diet, your lifestyle, the medicines you take, and whether you have certain medical conditions. Most people can reduce their chances of developing kidney stones by following the instructions below. Depending on your overall health and the type of kidney stones you tend to develop, your dietitian may give you more specific instructions.  What are tips for following this plan?  Reading food labels   Choose foods with "no salt added" or "low-salt" labels. Limit your sodium intake to less than 1500 mg per day.   Choose foods with calcium for each meal and snack. Try to eat about 300 mg of calcium at each meal. Foods that contain 200-500 mg of calcium per serving include:  ? 8 oz (237 ml) of milk, fortified nondairy milk, and fortified fruit juice.  ? 8 oz (237 ml) of kefir, yogurt, and soy yogurt.  ? 4 oz (118 ml) of tofu.  ? 1 oz of cheese.  ? 1 cup (300 g) of dried figs.  ? 1 cup (91 g) of cooked broccoli.  ? 1-3 oz can of sardines or mackerel.   Most people need 1000 to 1500 mg of calcium each day. Talk to your dietitian about how much calcium is recommended for you.  Shopping   Buy plenty of fresh fruits and vegetables. Most people do not need to avoid fruits and vegetables, even if they contain nutrients that may contribute to kidney stones.   When shopping for convenience foods, choose:  ? Whole pieces of fruit.  ? Premade salads with dressing on the side.  ? Low-fat fruit and yogurt smoothies.   Avoid buying frozen meals or prepared deli foods.   Look for foods with live cultures, such as yogurt and kefir.  Cooking   Do not add salt to food when cooking. Place a salt shaker on the table and allow each person to add his or her own salt to taste.   Use vegetable protein, such as beans, textured vegetable  protein (TVP), or tofu instead of meat in pasta, casseroles, and soups.  Meal planning     Eat less salt, if told by your dietitian. To do this:  ? Avoid eating processed or premade food.  ? Avoid eating fast food.   Eat less animal protein, including cheese, meat, poultry, or fish, if told by your dietitian. To do this:  ? Limit the number of times you have meat, poultry, fish, or cheese each week. Eat a diet free of meat at least 2 days a week.  ? Eat only one serving each day of meat, poultry, fish, or seafood.  ? When you prepare animal protein, cut pieces into small portion sizes. For most meat and fish, one serving is about the size of one deck of cards.   Eat at least 5 servings of fresh fruits and vegetables each day. To do this:  ? Keep fruits and vegetables on hand for snacks.  ? Eat 1 piece of fruit or a handful of berries with breakfast.  ? Have a salad and fruit at lunch.  ? Have two kinds of vegetables at dinner.   Limit foods that are high in a substance called oxalate. These include:  ? Spinach.  ? Rhubarb.  ? Beets.  ? Potato chips and french fries.  ? Nuts.     If you regularly take a diuretic medicine, make sure to eat at least 1-2 fruits or vegetables high in potassium each day. These include:  ? Avocado.  ? Banana.  ? Orange, prune, carrot, or tomato juice.  ? Baked potato.  ? Cabbage.  ? Beans and split peas.  General instructions     Drink enough fluid to keep your urine clear or pale yellow. This is the most important thing you can do.   Talk to your health care provider and dietitian about taking daily supplements. Depending on your health and the cause of your kidney stones, you may be advised:  ? Not to take supplements with vitamin C.  ? To take a calcium supplement.  ? To take a daily probiotic supplement.  ? To take other supplements such as magnesium, fish oil, or vitamin B6.   Take all medicines and supplements as told by your health care provider.   Limit alcohol intake to no  more than 1 drink a day for nonpregnant women and 2 drinks a day for men. One drink equals 12 oz of beer, 5 oz of wine, or 1 oz of hard liquor.   Lose weight if told by your health care provider. Work with your dietitian to find strategies and an eating plan that works best for you.  What foods are not recommended?  Limit your intake of the following foods, or as told by your dietitian. Talk to your dietitian about specific foods you should avoid based on the type of kidney stones and your overall health.  Grains  Breads. Bagels. Rolls. Baked goods. Salted crackers. Cereal. Pasta.  Vegetables  Spinach. Rhubarb. Beets. Canned vegetables. Pickles. Olives.  Meats and other protein foods  Nuts. Nut butters. Large portions of meat, poultry, or fish. Salted or cured meats. Deli meats. Hot dogs. Sausages.  Dairy  Cheese.  Beverages  Regular soft drinks. Regular vegetable juice.  Seasonings and other foods  Seasoning blends with salt. Salad dressings. Canned soups. Soy sauce. Ketchup. Barbecue sauce. Canned pasta sauce. Casseroles. Pizza. Lasagna. Frozen meals. Potato chips. French fries.  Summary   You can reduce your risk of kidney stones by making changes to your diet.   The most important thing you can do is drink enough fluid. You should drink enough fluid to keep your urine clear or pale yellow.   Ask your health care provider or dietitian how much protein from animal sources you should eat each day, and also how much salt and calcium you should have each day.  This information is not intended to replace advice given to you by your health care provider. Make sure you discuss any questions you have with your health care provider.  Document Released: 08/06/2010 Document Revised: 03/22/2016 Document Reviewed: 03/22/2016  Elsevier Interactive Patient Education  2019 Elsevier Inc.

## 2018-04-30 NOTE — Progress Notes (Signed)
   04/30/2018 10:09 AM   B[facility]ter [facility] Jul 26, 1985 [id number]  Referring provider: Allegra Grana, FNP [address] [place], [place] [address]  CC: kidney stones  HPI: I saw Mr. Shrider in urology clinic today in consultation for nephrolithiasis from Leanora Cover, NP.  He is a 33 year old healthy male with a strong family history of nephrolithiasis who presented to the ER in late November with acute right-sided flank pain and nausea and vomiting.  CT scan showed a 2 mm right distal ureteral stone with upstream hydronephrosis and no other nephrolithiasis.  He has since passed the stone and denies any ongoing flank pain or nausea.  He did report significant hematuria when he was passing the stone, but none since.  He denies any current complaints including fevers, chills, chest pain, shortness of breath.  There are no aggravating or alleviating factors.  Severity is mild.  He does not take any medications regularly.   PMH: Past Medical History:  Diagnosis Date  . Chicken pox   . Kidney stones   . OSA (obstructive sleep apnea)    per sleep study 8 or 12/2016 Sleep Med     Surgical History: Past Surgical History:  Procedure Laterality Date  . TONSILLECTOMY AND ADENOIDECTOMY      Allergies: No Known Allergies  Family History: Family History  Problem Relation Age of Onset  . Colon cancer Neg Hx   . Prostate cancer Neg Hx     Social History:  reports that he has never smoked. He has never used smokeless tobacco. He reports current alcohol use. He reports that he does not use drugs.  ROS: Please see flowsheet from today's date for complete review of systems.  Physical Exam: BP (!) 144/89 (BP Location: Left Arm, Patient Position: Sitting, Cuff Size: Large)   Pulse (!) 59   Ht 5\' 11"  (1.803 m)   Wt 231 lb 1.6 oz (104.8 kg)   BMI 32.23 kg/m    Constitutional:  Alert and oriented, No acute distress. Cardiovascular: No clubbing, cyanosis, or  edema. Respiratory: Normal respiratory effort, no increased work of breathing. GI: Abdomen is soft, nontender, nondistended, no abdominal masses GU: No CVA tenderness Lymph: No cervical or inguinal lymphadenopathy. Skin: No rashes, bruises or suspicious lesions. Neurologic: Grossly intact, no focal deficits, moving all 4 extremities. Psychiatric: Normal mood and affect.  Laboratory Data: None to review  Pertinent Imaging: I have personally reviewed the CT stone protocol dated 03/14/2018.  2 mm right distal ureteral stone (since passed).  No other nephrolithiasis.  Assessment & Plan:   In summary, Mr. Dejardin is a 33 year old healthy male with a strong family history of nephrolithiasis, and 2 episodes of spontaneously passed kidney stones.  He is currently asymptomatic, with no residual renal stones on CT.  We discussed general stone prevention strategies including adequate hydration with goal of producing 2.5 L of urine daily, increasing citric acid intake, increasing calcium intake during high oxalate meals, minimizing animal protein, and decreasing salt intake. Information about dietary recommendations given today.  We discussed 24-hour urine testing and metabolic work-up, he would like to defer at this time.  RTC 1 year for KUB, consider metabolic work-up if new stone disease  Sondra Come, MD  [facility] [address] [place], [place] [address] [phone]

## 2018-05-09 ENCOUNTER — Telehealth: Payer: Self-pay

## 2018-05-09 DIAGNOSIS — R51 Headache: Secondary | ICD-10-CM

## 2018-05-09 DIAGNOSIS — H538 Other visual disturbances: Secondary | ICD-10-CM

## 2018-05-09 DIAGNOSIS — R519 Headache, unspecified: Secondary | ICD-10-CM

## 2018-05-09 NOTE — Telephone Encounter (Signed)
I spoke with Dr. Martha Clan again at approximately 520 this evening.  He shared that  Did have concern for neuritis.  Advised him that it may be couple days for patient to have an MRI, perhaps longer to see a neurologist.    Dr Martha Clan stated he would call the patient and advise that he was seen in the emergency room.

## 2018-05-09 NOTE — Telephone Encounter (Signed)
Copied from CRM [id number]. Topic: General - Inquiry >> May 09, 2018  3:58 PM Maia Petties wrote: Reason for CRM: Calling to talk with Calvin Mcbride. He states pt has swollen optic nerves, more significant in the left eye than right. He is asking if he should or if Lauren can/should facilitate an MRI with and without contrast and lumbar puncture and/or referral to Neurology. He feels pt needs treatment faster than most neurologists would be able to get him in. Please call him first to discuss. Cell # [phone]

## 2018-05-09 NOTE — Telephone Encounter (Signed)
Spoke with eye doctor, Dr Marcille Buffy, [facility] in [place].  Office number [phone]  Concern for optical nerve swelling.   Vision correctable at 20/20  Spoke with Patient  He describes Vision changes in left eye, for past couple of months, more of a 'nussiance' past of weeks. Onset when he had renal stone.  No HA.  No fever, vomiting.   Paged on call [facility] neurology. 5pm.   a1c 5.5 2018 ; 120/82.  Spoke with on-call [place] neurologist Dr Karel Jarvis  She felt routine urgent referral to neurology appropriate concern was pseudotumor cerebri.  If concern for enteritis, she advised emergency evaluation. I have left message with Dr. Martha Clan in regards to clarification.  I will communicate all the patient  Spoke with patient , feels comfortable waiting for MRI, referral neurology.  He states he will call ophthalmologist in the morning as well.  At this time of dictation, I have not heard back from ophthalmologist.  Advised patient if any worsening vision changes, particularly eye pain, to go immediately to emergency room.  Verbalized understanding of all.

## 2018-05-10 ENCOUNTER — Other Ambulatory Visit: Payer: Self-pay | Admitting: Family

## 2018-05-10 ENCOUNTER — Telehealth: Payer: Self-pay | Admitting: Family

## 2018-05-10 ENCOUNTER — Ambulatory Visit (HOSPITAL_COMMUNITY)
Admission: RE | Admit: 2018-05-10 | Discharge: 2018-05-10 | Disposition: A | Discharge status: Home / Self Care | Payer: Commercial Managed Care - PPO | Source: Ambulatory Visit | Attending: Family | Admitting: Family

## 2018-05-10 ENCOUNTER — Telehealth: Payer: Self-pay | Admitting: *Deleted

## 2018-05-10 ENCOUNTER — Encounter: Payer: Self-pay | Admitting: Physician Assistant

## 2018-05-10 DIAGNOSIS — R51 Headache: Secondary | ICD-10-CM | POA: Diagnosis not present

## 2018-05-10 DIAGNOSIS — H538 Other visual disturbances: Secondary | ICD-10-CM

## 2018-05-10 DIAGNOSIS — R519 Headache, unspecified: Secondary | ICD-10-CM

## 2018-05-10 DIAGNOSIS — R609 Edema, unspecified: Secondary | ICD-10-CM

## 2018-05-10 MED ORDER — DEXAMETHASONE 4 MG PO TABS: 4.0000 mg | ORAL_TABLET | Freq: Two times a day (BID) | ORAL | 0 refills | Status: DC

## 2018-05-10 MED ORDER — GADOBUTROL 1 MMOL/ML IV SOLN
10.0000 mL | Freq: Once | INTRAVENOUS | Status: AC | PRN
  Administered 2018-05-10: 10 mL via INTRAVENOUS

## 2018-05-10 NOTE — Telephone Encounter (Signed)
Please call Dr Marcille Buffy, [facility] in [place].  Office number [phone]  Please update him regarding our mutual patient.  MRI was done today, showing a left 5. 2 cm suspected meningioma, significant edema.   Patient is being set up with [facility].

## 2018-05-10 NOTE — Telephone Encounter (Signed)
Called patient is evening in regards to MRI results which show left-sided 5.2cm suspected meningioma, edema.    Also spoke with [facility], [place], [place] on-call this evening.  He advises it was safe to see patient on outpatient basis, the ED was unnecessary.    He stated that their office again would call tomorrow to schedule an office consult.  He advised to go ahead and start Decadron 4 mg twice daily until he can be seen.  I placed referral to [facility] this evening, communicated all to patient and prescribed Decadron.  Currently patient feels well, no vomiting, headache.    Advised patient to let me know tomorrow if he does not hear from [facility].

## 2018-05-10 NOTE — Telephone Encounter (Signed)
Copied from CRM [id number]. Topic: General - Other >> May 10, 2018 11:24 AM Gaynelle Adu wrote: Reason for CRM: Patient is calling to request a call back in regards to his recent MRI. Please advise

## 2018-05-10 NOTE — Telephone Encounter (Signed)
I called patient & he stated that he got a call that MRI was not approved. I called [facility] for patient & she was unsure why patient was called? She stated that MRI was approved through the 31st of the month. They are faxing over confirmation. Patient was notified of this.

## 2018-05-11 ENCOUNTER — Telehealth: Payer: Self-pay | Admitting: *Deleted

## 2018-05-11 ENCOUNTER — Encounter: Payer: Self-pay | Admitting: Family

## 2018-05-11 DIAGNOSIS — H538 Other visual disturbances: Secondary | ICD-10-CM

## 2018-05-11 NOTE — Telephone Encounter (Signed)
Copied from CRM [id number]. Topic: General - Other >> May 11, 2018  9:06 AM Herby AbrahamJohnson, Shiquita C wrote: Reason for CRM: pt called in requesting a call back at: [phone]. Pt is requesting to speak with provider but didn't want to share with me what call back is in regards to.

## 2018-05-11 NOTE — Telephone Encounter (Signed)
Spoke to patient, there is [facility] MD , neurosurgeon,   that he prefers to see. He is scheduled to see [place] [facility] next Wednesday.  He is starting Decadron today.  Overall feels well.

## 2018-05-11 NOTE — Telephone Encounter (Signed)
I spoke with Dr. Martha Clan & I have faxed MRI to his office.

## 2018-05-11 NOTE — Telephone Encounter (Signed)
I spoke a receptionist at [facility] & she took my number to have Dr. Martha ClanHutto give me a call back. I have fax numer as well to fax MRI results.

## 2018-05-14 NOTE — Telephone Encounter (Signed)
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

## 2018-05-15 NOTE — Telephone Encounter (Signed)
I spoke with patient after calling Dr. Precious Gilding office getting the referral coordinator there's VM. Do you have any suggestions on what else I can do for patient? The referral was placed as urgent on Friday 05/11/18 & patient has called leaving VM's to office as well.

## 2018-05-15 NOTE — Telephone Encounter (Signed)
Patient's mom calling to request NP Arnett contact Dr. Precious Gilding office because the patient and herself are not able to get through to their office. They get a voicemail and have not heard back yet.

## 2018-05-16 ENCOUNTER — Telehealth: Payer: Self-pay

## 2018-05-16 ENCOUNTER — Other Ambulatory Visit: Payer: Self-pay

## 2018-05-16 DIAGNOSIS — R609 Edema, unspecified: Secondary | ICD-10-CM

## 2018-05-16 MED ORDER — DEXAMETHASONE 4 MG PO TABS: 4.0000 mg | ORAL_TABLET | Freq: Two times a day (BID) | ORAL | 0 refills | Status: AC

## 2018-05-16 NOTE — Telephone Encounter (Signed)
I spoke with patient & he had one day left of the Decadron. Do you want me to refill? He also has now been set up with [facility].

## 2018-05-16 NOTE — Telephone Encounter (Signed)
I had advised patient to let Dr. Barron AlvineEwend know that he was taking medication. I have sent refill to pharmacy as well.

## 2018-05-16 NOTE — Telephone Encounter (Signed)
I just spoke to them and they are calling him today to get him scheduled.  Thanks [employer]

## 2018-05-16 NOTE — Telephone Encounter (Signed)
Please call patient Yes may refill, advised him to discuss medication when he establishes with neurosurgery.

## 2018-05-16 NOTE — Telephone Encounter (Signed)
-----   Message from Allegra Grana, FNP sent at 05/16/2018 12:30 PM EST ----- Thanks Christiane Ha, call pt  Please inform a status update.  Please also ask him how much more of his Decadron that he still has?   I would advise he likely should stay on this medication.  This was recommended from neurology on call last week  It is up to him, if he would like to come off, I would advise we taper down. ----- Message ----- From: Mordecai Maes Sent: 05/16/2018  12:29 PM EST To: Allegra Grana, FNP  Contacted [facility]-they have the referral and will be calling him today to schedule him. Thanks, Melissa ----- Message ----- From: Allegra Grana, FNP Sent: 05/14/2018   8:10 AM EST To: Mordecai Maes  Hi there,I am just checking on [facility] neurosurgery referral? Thanks!

## 2018-05-17 NOTE — Telephone Encounter (Signed)
He has been scheduled on 2/17 with Dr. Barron Alvine @ [facility].

## 2018-05-18 NOTE — Telephone Encounter (Signed)
I spoke with Carnell from Dr Valetta Fuller office. I gave my direct number & he was sending a message back for one of the nurses to call me.

## 2018-05-18 NOTE — Telephone Encounter (Signed)
[facility], Dr. Valetta FullerEwend's office.  Please speak with  nurse and inform them that initially patient was going to be seen by [facility] who advised meto go ahead and start patient on Decadron 4 mg twice daily due to significant edema from 5.2 cm mass seen on MRI, thought to be meningioma.  He has not seeing Dr Barron AlvineEwend until February 17th.  We can fax over MRI brain if he is  Unable to see and if would be helpful.   Would Dr. Barron AlvineEwend still advised to stay on this medication?    If so I am happy to maintain him on decadron.

## 2018-05-18 NOTE — Telephone Encounter (Signed)
I spoke with a nurse navigator, Jill SideAlison from neuro oncology at Dr. Valetta FullerEwend's office. She wanted to clariy message & was going to consult Dr. Barron AlvineEwend or his PA, but she felt that Decadron was appropriate until he was seen. She said they had received Mr. Jackelyn KnifeMooty's MRI & that their own interpretation. She stated that if she had further questions that she would call me back directly.

## 2018-05-22 NOTE — Telephone Encounter (Signed)
Please communicate all of this to patient and we will continue Decadron as we have not heard from Dr. Precious Gilding office otherwise.    Let us know if needs a refill of this. You may refill if so

## 2018-05-23 NOTE — Telephone Encounter (Signed)
Patient has seen Dr. Barron AlvineEwend & he is now prescribing Decadron.

## 2018-06-08 MED ORDER — ACETAMINOPHEN 325 MG PO TABS
650.00 | ORAL_TABLET | ORAL | Status: DC
Start: ? — End: 2018-06-08

## 2018-06-08 MED ORDER — OXYCODONE HCL 5 MG PO TABS
5.00 | ORAL_TABLET | ORAL | Status: DC
Start: ? — End: 2018-06-08

## 2018-06-08 MED ORDER — DEXTROSE 10 % IV SOLN
12.50 | INTRAVENOUS | Status: DC
Start: ? — End: 2018-06-08

## 2018-06-08 MED ORDER — FAMOTIDINE 20 MG PO TABS
20.00 | ORAL_TABLET | ORAL | Status: DC
Start: 2018-06-08 — End: 2018-06-08

## 2018-06-08 MED ORDER — DEXAMETHASONE 4 MG PO TABS
4.00 | ORAL_TABLET | ORAL | Status: DC
Start: 2018-06-08 — End: 2018-06-08

## 2018-06-08 MED ORDER — DOCUSATE SODIUM 100 MG PO CAPS
100.00 | ORAL_CAPSULE | ORAL | Status: DC
Start: 2018-06-08 — End: 2018-06-08

## 2018-06-08 MED ORDER — FENTANYL CITRATE (PF) 50 MCG/ML IJ SOLN
50.00 | INTRAMUSCULAR | Status: DC
Start: ? — End: 2018-06-08

## 2018-06-08 MED ORDER — HYDRALAZINE HCL 20 MG/ML IJ SOLN
10.00 | INTRAMUSCULAR | Status: DC
Start: ? — End: 2018-06-08

## 2018-06-08 MED ORDER — ENOXAPARIN SODIUM 40 MG/0.4ML ~~LOC~~ SOLN
40.00 | SUBCUTANEOUS | Status: DC
Start: 2018-06-09 — End: 2018-06-08

## 2018-06-08 MED ORDER — GENERIC EXTERNAL MEDICATION
4.00 | Status: DC
Start: ? — End: 2018-06-08

## 2018-06-08 MED ORDER — INSULIN REGULAR HUMAN 100 UNIT/ML IJ SOLN
0.00 | INTRAMUSCULAR | Status: DC
Start: 2018-06-08 — End: 2018-06-08

## 2018-06-08 MED ORDER — LEVETIRACETAM 500 MG PO TABS
500.00 | ORAL_TABLET | ORAL | Status: DC
Start: 2018-06-08 — End: 2018-06-08

## 2018-06-08 MED ORDER — INFLUENZA VAC SPLIT QUAD 0.5 ML IM SUSY
0.50 | PREFILLED_SYRINGE | INTRAMUSCULAR | Status: DC
Start: ? — End: 2018-06-08

## 2018-06-08 MED ORDER — LABETALOL HCL 5 MG/ML IV SOLN
10.00 | INTRAVENOUS | Status: DC
Start: ? — End: 2018-06-08

## 2018-06-12 ENCOUNTER — Other Ambulatory Visit: Payer: Self-pay | Admitting: Family Medicine

## 2018-06-12 DIAGNOSIS — Z20828 Contact with and (suspected) exposure to other viral communicable diseases: Secondary | ICD-10-CM

## 2018-06-12 MED ORDER — OSELTAMIVIR PHOSPHATE 75 MG PO CAPS: 75.0000 mg | ORAL_CAPSULE | Freq: Two times a day (BID) | ORAL | 0 refills | Status: DC

## 2018-06-21 MED ORDER — ALBUTEROL SULFATE HFA 108 (90 BASE) MCG/ACT IN AERS
2.00 | INHALATION_SPRAY | RESPIRATORY_TRACT | Status: DC
Start: ? — End: 2018-06-21

## 2018-06-21 MED ORDER — DOCUSATE SODIUM 100 MG PO CAPS
100.00 | ORAL_CAPSULE | ORAL | Status: DC
Start: 2018-06-21 — End: 2018-06-21

## 2018-06-21 MED ORDER — OSELTAMIVIR PHOSPHATE 75 MG PO CAPS
75.00 | ORAL_CAPSULE | ORAL | Status: DC
Start: 2018-06-21 — End: 2018-06-21

## 2018-06-21 MED ORDER — DEXAMETHASONE 2 MG PO TABS
1.00 | ORAL_TABLET | ORAL | Status: DC
Start: 2018-06-22 — End: 2018-06-21

## 2018-06-21 MED ORDER — ACETAMINOPHEN 325 MG PO TABS
650.00 | ORAL_TABLET | ORAL | Status: DC
Start: ? — End: 2018-06-21

## 2018-06-21 MED ORDER — ONDANSETRON HCL 4 MG/2ML IJ SOLN
4.00 | INTRAMUSCULAR | Status: DC
Start: ? — End: 2018-06-21

## 2018-06-21 MED ORDER — OXYCODONE-ACETAMINOPHEN 5-325 MG PO TABS
1.00 | ORAL_TABLET | ORAL | Status: DC
Start: ? — End: 2018-06-21

## 2018-06-21 MED ORDER — FAMOTIDINE 20 MG PO TABS
20.00 | ORAL_TABLET | ORAL | Status: DC
Start: 2018-06-21 — End: 2018-06-21

## 2019-03-14 ENCOUNTER — Encounter: Payer: Self-pay | Admitting: Family Medicine

## 2019-04-24 ENCOUNTER — Ambulatory Visit: Payer: Commercial Managed Care - PPO | Attending: Internal Medicine

## 2019-04-24 DIAGNOSIS — Z20822 Contact with and (suspected) exposure to covid-19: Secondary | ICD-10-CM

## 2019-04-25 LAB — NOVEL CORONAVIRUS, NAA: SARS-CoV-2, NAA: NOT DETECTED

## 2019-05-06 ENCOUNTER — Ambulatory Visit: Payer: Commercial Managed Care - PPO | Admitting: Urology

## 2019-05-08 ENCOUNTER — Ambulatory Visit: Payer: Self-pay | Admitting: Urology

## 2020-02-19 ENCOUNTER — Encounter: Payer: Self-pay | Admitting: Family

## 2020-03-18 ENCOUNTER — Other Ambulatory Visit: Payer: Self-pay

## 2020-03-18 ENCOUNTER — Other Ambulatory Visit
Admission: RE | Admit: 2020-03-18 | Discharge: 2020-03-18 | Disposition: A | Discharge status: Home / Self Care | Payer: Commercial Managed Care - PPO | Source: Ambulatory Visit | Attending: Family | Admitting: Family

## 2020-03-18 ENCOUNTER — Telehealth: Payer: Self-pay | Admitting: Family

## 2020-03-18 ENCOUNTER — Encounter: Payer: Self-pay | Admitting: Family

## 2020-03-18 ENCOUNTER — Ambulatory Visit (INDEPENDENT_AMBULATORY_CARE_PROVIDER_SITE_OTHER): Payer: Commercial Managed Care - PPO | Admitting: Family

## 2020-03-18 VITALS — BP 116/74 | HR 59 | Temp 98.3°F | Ht 70.5 in | Wt 239.8 lb

## 2020-03-18 DIAGNOSIS — Z0000 Encounter for general adult medical examination without abnormal findings: Secondary | ICD-10-CM | POA: Diagnosis not present

## 2020-03-18 DIAGNOSIS — E875 Hyperkalemia: Secondary | ICD-10-CM | POA: Diagnosis not present

## 2020-03-18 LAB — CBC WITH DIFFERENTIAL/PLATELET
Basophils Absolute: 0 10*3/uL (ref 0.0–0.1)
Basophils Relative: 0.6 % (ref 0.0–3.0)
Eosinophils Absolute: 0.3 10*3/uL (ref 0.0–0.7)
Eosinophils Relative: 3.6 % (ref 0.0–5.0)
HCT: 47.6 % (ref 39.0–52.0)
Hemoglobin: 16.4 g/dL (ref 13.0–17.0)
Lymphocytes Relative: 33.7 % (ref 12.0–46.0)
Lymphs Abs: 2.4 10*3/uL (ref 0.7–4.0)
MCHC: 34.4 g/dL (ref 30.0–36.0)
MCV: 85.8 fl (ref 78.0–100.0)
Monocytes Absolute: 0.6 10*3/uL (ref 0.1–1.0)
Monocytes Relative: 8.8 % (ref 3.0–12.0)
Neutro Abs: 3.9 10*3/uL (ref 1.4–7.7)
Neutrophils Relative %: 53.3 % (ref 43.0–77.0)
Platelets: 249 10*3/uL (ref 150.0–400.0)
RBC: 5.55 Mil/uL (ref 4.22–5.81)
RDW: 14 % (ref 11.5–15.5)
WBC: 7.2 10*3/uL (ref 4.0–10.5)

## 2020-03-18 LAB — BASIC METABOLIC PANEL
Anion gap: 10 (ref 5–15)
BUN: 17 mg/dL (ref 6–20)
CO2: 27 mmol/L (ref 22–32)
Calcium: 9 mg/dL (ref 8.9–10.3)
Chloride: 99 mmol/L (ref 98–111)
Creatinine, Ser: 0.95 mg/dL (ref 0.61–1.24)
GFR, Estimated: >60 mL/min (ref >60)
Glucose, Bld: 93 mg/dL (ref 70–99)
Potassium: 3.8 mmol/L (ref 3.5–5.1)
Sodium: 136 mmol/L (ref 135–145)

## 2020-03-18 LAB — COMPREHENSIVE METABOLIC PANEL
ALT: 51 U/L (ref 0–53)
AST: 30 U/L (ref 0–37)
Albumin: 4.9 g/dL (ref 3.5–5.2)
Alkaline Phosphatase: 74 U/L (ref 39–117)
BUN: 17 mg/dL (ref 6–23)
CO2: 32 mEq/L (ref 19–32)
Calcium: 10.1 mg/dL (ref 8.4–10.5)
Chloride: 99 mEq/L (ref 96–112)
Creatinine, Ser: 1.13 mg/dL (ref 0.40–1.50)
GFR: 83.74 mL/min (ref >60.00)
Glucose, Bld: 96 mg/dL (ref 70–99)
Potassium: 5.2 mEq/L — ABNORMAL HIGH (ref 3.5–5.1)
Sodium: 138 mEq/L (ref 135–145)
Total Bilirubin: 0.7 mg/dL (ref 0.2–1.2)
Total Protein: 7.7 g/dL (ref 6.0–8.3)

## 2020-03-18 LAB — LIPID PANEL
Cholesterol: 228 mg/dL — ABNORMAL HIGH (ref 0–200)
HDL: 43.4 mg/dL (ref >39.00)
NonHDL: 184.39
Total CHOL/HDL Ratio: 5
Triglycerides: 252 mg/dL — ABNORMAL HIGH (ref 0.0–149.0)
VLDL: 50.4 mg/dL — ABNORMAL HIGH (ref 0.0–40.0)

## 2020-03-18 LAB — TSH: TSH: 2.96 u[IU]/mL (ref 0.35–4.50)

## 2020-03-18 LAB — LDL CHOLESTEROL, DIRECT: Direct LDL: 166 mg/dL

## 2020-03-18 LAB — HEMOGLOBIN A1C: Hgb A1c MFr Bld: 5.5 % (ref 4.6–6.5)

## 2020-03-18 LAB — VITAMIN D 25 HYDROXY (VIT D DEFICIENCY, FRACTURES): VITD: 32.79 ng/mL (ref 30.00–100.00)

## 2020-03-18 NOTE — Progress Notes (Signed)
Subjective:    Patient ID: Calvin Mcbride, male    DOB: [birth year], 34 y.o.   MRN: [id number]  CC: Calvin Mcbride is a 34 y.o. male who presents today for physical exam.    HPI: Feels well today No complaints 1 month daughter at home.   No depression or anxiety. Work is going well  OSA- mild and never felt he needed cipap NO fatigue, HA Occassional snores    Colorectal  Cancer Screening: no early family history  No testicular mass, swelling, or pain.   Immunizations       Tetanus - utd       Hepatitis C screening - Candidate for, consents  Labs: Screening labs today. Exercise: Gets regular exercise with weight lifting once per week. No cp, sob.  Alcohol use:  occassional Smoking/tobacco use: Nonsmoker.     HISTORY:  Past Medical History:  Diagnosis Date  . Chicken pox   . Kidney stones   . OSA (obstructive sleep apnea)    per sleep study 8 or 12/2016 Sleep [facility]     Past Surgical History:  Procedure Laterality Date  . TONSILLECTOMY AND ADENOIDECTOMY     Family History  Problem Relation Age of Onset  . Heart disease Maternal Grandfather   . Colon cancer Neg Hx   . Prostate cancer Neg Hx       ALLERGIES: Patient has no known allergies.  Current Outpatient Medications on File Prior to Visit  Medication Sig Dispense Refill  . oseltamivir (TAMIFLU) 75 MG capsule Take 1 capsule (75 mg total) by mouth 2 (two) times daily. 10 capsule 0   No current facility-administered medications on file prior to visit.    Social History   Tobacco Use  . Smoking status: Never Smoker  . Smokeless tobacco: Never Used  Vaping Use  . Vaping Use: Never used  Substance Use Topics  . Alcohol use: Yes    Comment: Occasional  . Drug use: No    Review of Systems  Constitutional: Negative for chills and fever.  HENT: Negative for congestion.   Respiratory: Negative for cough.   Cardiovascular: Negative for chest pain, palpitations and leg swelling.    Gastrointestinal: Negative for diarrhea, nausea and vomiting.  Genitourinary: Negative for scrotal swelling.  Musculoskeletal: Negative for myalgias.  Skin: Negative for rash.  Neurological: Negative for headaches.  Hematological: Negative for adenopathy.  Psychiatric/Behavioral: Negative for confusion, self-injury, sleep disturbance and suicidal ideas.      Objective:    BP 116/74   Pulse (!) 59   Temp 98.3 F (36.8 C)   Ht 5' 10.5" (1.791 m)   Wt 239 lb 12.8 oz (108.8 kg)   SpO2 98%   BMI 33.92 kg/m   BP Readings from Last 3 Encounters:  03/18/20 116/74  04/30/18 (!) 144/89  03/14/18 120/82   Wt Readings from Last 3 Encounters:  03/18/20 239 lb 12.8 oz (108.8 kg)  04/30/18 231 lb 1.6 oz (104.8 kg)  03/14/18 230 lb (104.3 kg)    Physical Exam Vitals reviewed.  Constitutional:      Appearance: He is well-developed.  Neck:     Thyroid: No thyroid mass or thyromegaly.  Cardiovascular:     Rate and Rhythm: Regular rhythm.     Heart sounds: Normal heart sounds.  Pulmonary:     Effort: Pulmonary effort is normal. No respiratory distress.     Breath sounds: Normal breath sounds. No wheezing, rhonchi or rales.  Lymphadenopathy:  Head:     Right side of head: No submental, submandibular, tonsillar, preauricular, posterior auricular or occipital adenopathy.     Left side of head: No submental, submandibular, tonsillar, preauricular, posterior auricular or occipital adenopathy.     Cervical: No cervical adenopathy.  Skin:    General: Skin is warm and dry.  Neurological:     Mental Status: He is alert.  Psychiatric:        Speech: Speech normal.        Behavior: Behavior normal.        Assessment & Plan:   Problem List Items Addressed This Visit      Other   Routine physical examination - Primary    Encouraged to increase exercise to 3-4 times per week. Advised to continue self testicular exam; he declines testicular exam today in absence of concerns.        Relevant Orders   CBC with Differential/Platelet   Comprehensive metabolic panel   TSH   Hemoglobin A1c   Hepatitis C antibody   Lipid panel   VITAMIN D 25 Hydroxy (Vit-D Deficiency, Fractures)       I am having Calvin Mcbride. Slight maintain his oseltamivir.   No orders of the defined types were placed in this encounter.   Return precautions given.   Risks, benefits, and alternatives of the medications and treatment plan prescribed today were discussed, and patient expressed understanding.   Education regarding symptom management and diagnosis given to patient on AVS.   Continue to follow with Calvin Grana, FNP for routine health maintenance.   Calvin Mcbride and I agreed with plan.   Calvin Plowman, FNP

## 2020-03-18 NOTE — Telephone Encounter (Signed)
Call pt Potassium slightly high which I suspect is transient Please ask if would go to [facility] today, tomorrow for recheck

## 2020-03-18 NOTE — Telephone Encounter (Signed)
Pt cannot go to hospital tonight, but will go on Friday to have rechecked. He knows if any chest pain tor irregular heartbeat to go to ED or UC to be seen.

## 2020-03-18 NOTE — Patient Instructions (Signed)
Nice to see you!   Health Maintenance, Male Adopting a healthy lifestyle and getting preventive care are important in promoting health and wellness. Ask your health care provider about:  The right schedule for you to have regular tests and exams.  Things you can do on your own to prevent diseases and keep yourself healthy. What should I know about diet, weight, and exercise? Eat a healthy diet   Eat a diet that includes plenty of vegetables, fruits, low-fat dairy products, and lean protein.  Do not eat a lot of foods that are high in solid fats, added sugars, or sodium. Maintain a healthy weight Body mass index (BMI) is a measurement that can be used to identify possible weight problems. It estimates body fat based on height and weight. Your health care provider can help determine your BMI and help you achieve or maintain a healthy weight. Get regular exercise Get regular exercise. This is one of the most important things you can do for your health. Most adults should:  Exercise for at least 150 minutes each week. The exercise should increase your heart rate and make you sweat (moderate-intensity exercise).  Do strengthening exercises at least twice a week. This is in addition to the moderate-intensity exercise.  Spend less time sitting. Even light physical activity can be beneficial. Watch cholesterol and blood lipids Have your blood tested for lipids and cholesterol at 36 years of age, then have this test every 5 years. You may need to have your cholesterol levels checked more often if:  Your lipid or cholesterol levels are high.  You are older than 36 years of age.  You are at high risk for heart disease. What should I know about cancer screening? Many types of cancers can be detected early and may often be prevented. Depending on your health history and family history, you may need to have cancer screening at various ages. This may include screening for:  Colorectal  cancer.  Prostate cancer.  Skin cancer.  Lung cancer. What should I know about heart disease, diabetes, and high blood pressure? Blood pressure and heart disease  High blood pressure causes heart disease and increases the risk of stroke. This is more likely to develop in people who have high blood pressure readings, are of African descent, or are overweight.  Talk with your health care provider about your target blood pressure readings.  Have your blood pressure checked: ? Every 3-5 years if you are 18-39 years of age. ? Every year if you are 40 years old or older.  If you are between the ages of 65 and 75 and are a current or former smoker, ask your health care provider if you should have a one-time screening for abdominal aortic aneurysm (AAA). Diabetes Have regular diabetes screenings. This checks your fasting blood sugar level. Have the screening done:  Once every three years after age 45 if you are at a normal weight and have a low risk for diabetes.  More often and at a younger age if you are overweight or have a high risk for diabetes. What should I know about preventing infection? Hepatitis B If you have a higher risk for hepatitis B, you should be screened for this virus. Talk with your health care provider to find out if you are at risk for hepatitis B infection. Hepatitis C Blood testing is recommended for:  Everyone born from 1945 through 1965.  Anyone with known risk factors for hepatitis C. Sexually transmitted infections (STIs)    You should be screened each year for STIs, including gonorrhea and chlamydia, if: ? You are sexually active and are younger than 36 years of age. ? You are older than 36 years of age and your health care provider tells you that you are at risk for this type of infection. ? Your sexual activity has changed since you were last screened, and you are at increased risk for chlamydia or gonorrhea. Ask your health care provider if you are at  risk.  Ask your health care provider about whether you are at high risk for HIV. Your health care provider may recommend a prescription medicine to help prevent HIV infection. If you choose to take medicine to prevent HIV, you should first get tested for HIV. You should then be tested every 3 months for as long as you are taking the medicine. Follow these instructions at home: Lifestyle  Do not use any products that contain nicotine or tobacco, such as cigarettes, e-cigarettes, and chewing tobacco. If you need help quitting, ask your health care provider.  Do not use street drugs.  Do not share needles.  Ask your health care provider for help if you need support or information about quitting drugs. Alcohol use  Do not drink alcohol if your health care provider tells you not to drink.  If you drink alcohol: ? Limit how much you have to 0-2 drinks a day. ? Be aware of how much alcohol is in your drink. In the [place] one drink equals one 12 oz bottle of beer (355 mL), one 5 oz glass of wine (148 mL), or one 1 oz glass of hard liquor (44 mL). General instructions  Schedule regular health, dental, and eye exams.  Stay current with your vaccines.  Tell your health care provider if: ? You often feel depressed. ? You have ever been abused or do not feel safe at home. Summary  Adopting a healthy lifestyle and getting preventive care are important in promoting health and wellness.  Follow your health care provider's instructions about healthy diet, exercising, and getting tested or screened for diseases.  Follow your health care provider's instructions on monitoring your cholesterol and blood pressure. This information is not intended to replace advice given to you by your health care provider. Make sure you discuss any questions you have with your health care provider. Document Revised: 04/04/2018 Document Reviewed: 04/04/2018 Elsevier Patient Education  2020 Elsevier Inc.  

## 2020-03-18 NOTE — Assessment & Plan Note (Signed)
Encouraged to increase exercise to 3-4 times per week. Advised to continue self testicular exam; he declines testicular exam today in absence of concerns.

## 2020-03-20 LAB — HEPATITIS C ANTIBODY
Hepatitis C Ab: NONREACTIVE
SIGNAL TO CUT-OFF: 0.01 (ref <1.00)

## 2020-04-29 ENCOUNTER — Emergency Department (HOSPITAL_COMMUNITY)
Admission: EM | Admit: 2020-04-29 | Discharge: 2020-04-29 | Disposition: A | Discharge status: Home / Self Care | Payer: Commercial Managed Care - PPO | Attending: Emergency Medicine | Admitting: Emergency Medicine

## 2020-04-29 ENCOUNTER — Emergency Department (HOSPITAL_COMMUNITY): Payer: Commercial Managed Care - PPO

## 2020-04-29 DIAGNOSIS — S00211A Abrasion of right eyelid and periocular area, initial encounter: Secondary | ICD-10-CM | POA: Insufficient documentation

## 2020-04-29 DIAGNOSIS — S00512A Abrasion of oral cavity, initial encounter: Secondary | ICD-10-CM | POA: Insufficient documentation

## 2020-04-29 DIAGNOSIS — R55 Syncope and collapse: Secondary | ICD-10-CM | POA: Diagnosis present

## 2020-04-29 DIAGNOSIS — Z20822 Contact with and (suspected) exposure to covid-19: Secondary | ICD-10-CM | POA: Insufficient documentation

## 2020-04-29 DIAGNOSIS — S01512A Laceration without foreign body of oral cavity, initial encounter: Secondary | ICD-10-CM | POA: Insufficient documentation

## 2020-04-29 DIAGNOSIS — R569 Unspecified convulsions: Secondary | ICD-10-CM | POA: Diagnosis not present

## 2020-04-29 DIAGNOSIS — W268XXA Contact with other sharp object(s), not elsewhere classified, initial encounter: Secondary | ICD-10-CM | POA: Insufficient documentation

## 2020-04-29 DIAGNOSIS — Y9259 Other trade areas as the place of occurrence of the external cause: Secondary | ICD-10-CM | POA: Diagnosis not present

## 2020-04-29 DIAGNOSIS — Y990 Civilian activity done for income or pay: Secondary | ICD-10-CM | POA: Insufficient documentation

## 2020-04-29 LAB — CBC
HCT: 47.7 % (ref 39.0–52.0)
Hemoglobin: 15.6 g/dL (ref 13.0–17.0)
MCH: 28.7 pg (ref 26.0–34.0)
MCHC: 32.7 g/dL (ref 30.0–36.0)
MCV: 87.8 fL (ref 80.0–100.0)
Platelets: 220 10*3/uL (ref 150–400)
RBC: 5.43 MIL/uL (ref 4.22–5.81)
RDW: 13.6 % (ref 11.5–15.5)
WBC: 11.8 10*3/uL — ABNORMAL HIGH (ref 4.0–10.5)
nRBC: 0 % (ref 0.0–0.2)

## 2020-04-29 LAB — BASIC METABOLIC PANEL
Anion gap: 11 (ref 5–15)
BUN: 19 mg/dL (ref 6–20)
CO2: 25 mmol/L (ref 22–32)
Calcium: 9.5 mg/dL (ref 8.9–10.3)
Chloride: 100 mmol/L (ref 98–111)
Creatinine, Ser: 1.21 mg/dL (ref 0.61–1.24)
GFR, Estimated: >60 mL/min (ref >60)
Glucose, Bld: 112 mg/dL — ABNORMAL HIGH (ref 70–99)
Potassium: 3.6 mmol/L (ref 3.5–5.1)
Sodium: 136 mmol/L (ref 135–145)

## 2020-04-29 LAB — RESP PANEL BY RT-PCR (FLU A&B, COVID) ARPGX2
Influenza A by PCR: NEGATIVE
Influenza B by PCR: NEGATIVE
SARS Coronavirus 2 by RT PCR: NEGATIVE

## 2020-04-29 LAB — CBG MONITORING, ED: Glucose-Capillary: 104 mg/dL — ABNORMAL HIGH (ref 70–99)

## 2020-04-29 MED ORDER — LEVETIRACETAM 500 MG PO TABS: 500.0000 mg | ORAL_TABLET | Freq: Two times a day (BID) | ORAL | 1 refills | Status: DC

## 2020-04-29 MED ORDER — LEVETIRACETAM IN NACL 1000 MG/100ML IV SOLN
1000.0000 mg | Freq: Once | INTRAVENOUS | Status: AC
  Administered 2020-04-29: 1000 mg via INTRAVENOUS
  Filled 2020-04-29: qty 100

## 2020-04-29 NOTE — ED Triage Notes (Signed)
Pt arrives to ED via [facility] from home after wife found him outside and seemed confused, he was noted to have oral trauma and a abrasion under his right eye. he did bite his tongue. This was unwitnessed and unsure what happened, last thing he remembers his working outside in his garage.  Denies any seizure hx , he states he did have a tumor removed 2 years ago.
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
## 2020-04-29 NOTE — Discharge Instructions (Addendum)
Like we discussed, please follow-up with your neurologist tomorrow.  They should be able to see the results of your CT imaging.  They can also work with you to determine if MRI imaging is necessary.  Again, you should not be operating a motor vehicle until you have been seizure-free for 6 months.  You can talk to your neurologist further regarding this.  I'm discharging you on a medication called Keppra.  This is an antiepileptic medication.  This is the same medication that you received in your IV while you are were in the emergency department.  You need to take this twice a day.  Do not miss a dose.  You can always return to the emergency department with new or worsening symptoms.  Please do not hesitate to do so.

## 2020-04-29 NOTE — ED Notes (Signed)
Collected pt CBG-104

## 2020-04-30 ENCOUNTER — Telehealth: Payer: Self-pay | Admitting: Family

## 2020-04-30 ENCOUNTER — Other Ambulatory Visit: Payer: Self-pay | Admitting: Family

## 2020-04-30 DIAGNOSIS — R55 Syncope and collapse: Secondary | ICD-10-CM

## 2020-04-30 DIAGNOSIS — G40909 Epilepsy, unspecified, not intractable, without status epilepticus: Secondary | ICD-10-CM

## 2020-04-30 MED ORDER — LEVETIRACETAM 750 MG PO TABS: 750.0000 mg | ORAL_TABLET | Freq: Two times a day (BID) | ORAL | 1 refills | Status: DC

## 2020-04-30 NOTE — Addendum Note (Signed)
Addended by: Allegra Grana on: 04/30/2020 12:15 PM   Modules accepted: Orders

## 2020-04-30 NOTE — Telephone Encounter (Signed)
rasheedah how quickly can he be seen by neurology? Also, I ordered MRI brain- patient has this scheduled with neurosurgery- you may CANCEL.     I spoke with patient  He felt well today and has been compliant with keppra 500 mg BID. He felt sleepy on medication. No irritability. No syncope, confusion, dizziness.   Wife has also spoke with Barnie Alderman, NP in Dr Valetta Fuller office whom has ordered MRI brain, scheduled 05/10/20.Declines my ordering MRI brain.  I spoke at length with on call neurology Dr Thomasena Edis whom reviewed CT head and ED course. He advised MRI with and wo constrast and didn't feel necessary to be seen by  Neurology in one day. He felt reasonable in  one week. He advised to increase keppra to 750mg  BID due to patient BMI.  I explained all this to patient and wife. Pt has appt with me on Monday.   rasheedah how quickly can he be seen by neurology? Also, I ordered MRI brain- patient has this scheduled with neurosurgery- you may CANCEL.

## 2020-04-30 NOTE — Telephone Encounter (Signed)
Call pt wife, pt Referral placed urgent to neurology Let us know if you dont hear back within a week in regards to an appointment being scheduled.

## 2020-04-30 NOTE — Telephone Encounter (Signed)
pt wife called he was seen in the ED yesterday for a Seizure and they are needing a referral to Neurology.. The ED told them that his Primary Care doctor would do the referral

## 2020-05-01 NOTE — Telephone Encounter (Signed)
MRI Berkley Harvey was faxed into pt insurance there is no one to speak to. I can try and see if I can speak to someone.   [facility] are already closed for the day.

## 2020-05-01 NOTE — Telephone Encounter (Signed)
I believe you spoke with patient & wife yesterday?

## 2020-05-01 NOTE — Telephone Encounter (Signed)
I placed a new ref that is marked as urgent Let me know if helpful and please let me know update on monday

## 2020-05-01 NOTE — Telephone Encounter (Signed)
The referral was placed as Routine yesterday not urgent. I called the ofc of [facility] and [facility] they are closed.

## 2020-05-01 NOTE — Addendum Note (Signed)
Addended by: Allegra Grana on: 05/01/2020 04:10 PM   Modules accepted: Orders

## 2020-05-01 NOTE — Telephone Encounter (Signed)
Received and sent. 

## 2020-05-04 ENCOUNTER — Other Ambulatory Visit: Payer: Self-pay

## 2020-05-04 ENCOUNTER — Ambulatory Visit (INDEPENDENT_AMBULATORY_CARE_PROVIDER_SITE_OTHER): Payer: Commercial Managed Care - PPO | Admitting: Family

## 2020-05-04 ENCOUNTER — Encounter: Payer: Self-pay | Admitting: Family

## 2020-05-04 VITALS — BP 124/72 | HR 79 | Temp 97.9°F | Ht 70.5 in | Wt 235.8 lb

## 2020-05-04 DIAGNOSIS — R55 Syncope and collapse: Secondary | ICD-10-CM | POA: Diagnosis not present

## 2020-05-04 DIAGNOSIS — G4733 Obstructive sleep apnea (adult) (pediatric): Secondary | ICD-10-CM

## 2020-05-04 DIAGNOSIS — R0981 Nasal congestion: Secondary | ICD-10-CM | POA: Diagnosis not present

## 2020-05-04 MED ORDER — AMOXICILLIN-POT CLAVULANATE 875-125 MG PO TABS: 1.0000 | ORAL_TABLET | Freq: Two times a day (BID) | ORAL | 0 refills | Status: AC

## 2020-05-04 NOTE — Telephone Encounter (Signed)
Awesome !! TY

## 2020-05-04 NOTE — Assessment & Plan Note (Addendum)
Afebrile. Duration 7 days and suspected bacterial sinusitis.  Start augmentin .He will let me know how he is doing.

## 2020-05-04 NOTE — Progress Notes (Signed)
Subjective:    Patient ID: Calvin Mcbride, male    DOB: [birth year], 35 y.o.   MRN: [id number]  CC: Calvin Mcbride is a 35 y.o. male who presents today for follow up.   HPI: Follow up ED visit for suspected seizure like activity. Accompanied by wife today  Describes syncope episode in driveway after walking from garage to house which is approx 100 yards. Suspectss that he was confused for 20-30 minutes prior as he had grass stain on pants. Wife suspects he fell in yard prior to syncope and then walked towards house. Syncopal episode in gravel drive.  Not sure how long he was unconscious , as he walked to house after episode. Still confused about syncope however reports that he was verbal, answering questions.  Abrasion to right side face. Wife called 911 due to how he was acting and confusion.   That morning felt normal aside from congestion. No vision changes, HA, fever. He has remained home from work. No HA, problems concentrating since syncopal event.   No known personal or family h/o cardiac arrhythmia. No cp, dizziness. Reports good exercise tolerance.   H/o brain tumor, craniotomy.  He notes in the passed that he has  'passed out' due to dehydration, last instance when dehydrated from suspected norovirus.    Leading to syncope episode endorses not drinking lots of water.   Compliant with keppra 500mg  BID and opted not to increase dose to 750 mg BID. He is not driving for 6 months.   Appt with Dr 05/05/20  MRI brain scheduled through Dr 07/03/20 office 05/10/20 per patient.   Complains of nasal congestion x 7 days. Endorses cough. No sob, fever.  Has tried mucinex without relief No recent antibiotic.  PCR covid negative 5 days ago  Diagnosed with OSA and never got a cipap as he was told OSA mild.    CT head-Postoperative changes from bifrontal craniotomy for resection of previously seen left frontal meningioma.Additional hypodensity at the adjacent  parasagittal anterior right frontal lobe.   HISTORY:  Past Medical History:  Diagnosis Date   Chicken pox    Kidney stones    OSA (obstructive sleep apnea)    per sleep study 8 or 12/2016 Sleep Med    Past Surgical History:  Procedure Laterality Date   Meningioma Left 2020   brain   TONSILLECTOMY AND ADENOIDECTOMY     Family History  Problem Relation Age of Onset   Heart disease Maternal Grandfather    Colon cancer Neg Hx    Prostate cancer Neg Hx     Allergies: Patient has no known allergies. Current Outpatient Medications on File Prior to Visit  Medication Sig Dispense Refill   levETIRAcetam (KEPPRA) 500 MG tablet Take 500 mg by mouth 2 (two) times daily.     No current facility-administered medications on file prior to visit.    Social History   Tobacco Use   Smoking status: Never Smoker   Smokeless tobacco: Never Used  Vaping Use   Vaping Use: Never used  Substance Use Topics   Alcohol use: Yes    Comment: Occasional   Drug use: No    Review of Systems  Constitutional: Negative for chills and fever.  HENT: Positive for congestion. Negative for sore throat.   Respiratory: Negative for cough and shortness of breath.   Cardiovascular: Negative for chest pain and palpitations.  Gastrointestinal: Negative for nausea and vomiting.  Neurological: Negative for dizziness and headaches.  Psychiatric/Behavioral:  The patient is not nervous/anxious.       Objective:    BP 124/72    Pulse 79    Temp 97.9 F (36.6 C)    Ht 5' 10.5" (1.791 m)    Wt 235 lb 12.8 oz (107 kg)    SpO2 99%    BMI 33.36 kg/m  BP Readings from Last 3 Encounters:  05/05/20 (!) 146/92  05/04/20 124/72  04/29/20 (!) 148/90   Wt Readings from Last 3 Encounters:  05/05/20 238 lb 9.6 oz (108.2 kg)  05/04/20 235 lb 12.8 oz (107 kg)  03/18/20 239 lb 12.8 oz (108.8 kg)    Physical Exam Vitals reviewed.  Constitutional:      General: Vital signs are normal.     Appearance: He  is well-developed and well-nourished.  HENT:     Head: Normocephalic and atraumatic.     Right Ear: Hearing, tympanic membrane, ear canal and external ear normal. No decreased hearing noted. No drainage, swelling or tenderness. No middle ear effusion. Tympanic membrane is not injected, erythematous or bulging.     Left Ear: Hearing, tympanic membrane, ear canal and external ear normal. No decreased hearing noted. No drainage, swelling or tenderness.  No middle ear effusion. Tympanic membrane is not injected, erythematous or bulging.     Nose: Nose normal.     Right Sinus: No maxillary sinus tenderness or frontal sinus tenderness.     Left Sinus: No maxillary sinus tenderness or frontal sinus tenderness.     Mouth/Throat:     Mouth: Oropharynx is clear and moist and mucous membranes are normal.     Pharynx: Uvula midline. No oropharyngeal exudate, posterior oropharyngeal edema or posterior oropharyngeal erythema.     Tonsils: No tonsillar abscesses.  Eyes:     Conjunctiva/sclera: Conjunctivae normal.  Cardiovascular:     Rate and Rhythm: Regular rhythm.     Heart sounds: Normal heart sounds.  Pulmonary:     Effort: Pulmonary effort is normal. No respiratory distress.     Breath sounds: Normal breath sounds. No wheezing, rhonchi or rales.  Lymphadenopathy:     Head:     Right side of head: No submental, submandibular, tonsillar, preauricular, posterior auricular or occipital adenopathy.     Left side of head: No submental, submandibular, tonsillar, preauricular, posterior auricular or occipital adenopathy.     Cervical: No cervical adenopathy.  Skin:    General: Skin is warm and dry.  Neurological:     Mental Status: He is alert.  Psychiatric:        Mood and Affect: Mood and affect normal.        Speech: Speech normal.        Behavior: Behavior normal.        Assessment & Plan:   Problem List Items Addressed This Visit      Respiratory   OSA (obstructive sleep apnea)    Not  currently wearing. Dr Epimenio Foot has ordered home sleep study.       Sinus congestion - Primary    Afebrile. Duration 7 days and suspected bacterial sinusitis.  Start augmentin .He will let me know how he is doing.      Relevant Medications   amoxicillin-clavulanate (AUGMENTIN) 875-125 MG tablet     Other   Syncope    Etiology for syncope is not known at this time. Particularly complex as patient reports confusion prior and after syncope, syncope was not witnessed. Currently undergoing neurologic evaluation for possible seizure  with Dr Epimenio Foot. Patient well appearing today. He is not orthostatic. We discussed possible cardiology evaluation after seeing neurology. Advised not to drive per seizure protocol for 6 months or until released by neurology. MRI brain scheduled by neurosurgery. Close follow up.           I am having Belva Crome. Pilgrim start on amoxicillin-clavulanate. I am also having him maintain his levETIRAcetam.   Meds ordered this encounter  Medications   amoxicillin-clavulanate (AUGMENTIN) 875-125 MG tablet    Sig: Take 1 tablet by mouth 2 (two) times daily for 7 days.    Dispense:  14 tablet    Refill:  0    Order Specific Question:   Supervising Provider    Answer:   Sherlene Shams [[id number]]    Return precautions given.   Risks, benefits, and alternatives of the medications and treatment plan prescribed today were discussed, and patient expressed understanding.   Education regarding symptom management and diagnosis given to patient on AVS.  Continue to follow with Allegra Grana, FNP for routine health maintenance.   Odette Horns and I agreed with plan.   Rennie Plowman, FNP  I have spent 45 minutes with a patient including precharting, exam, reviewing medical records, and discussion plan of care.

## 2020-05-04 NOTE — Telephone Encounter (Signed)
I called [facility] pt will be called today to get sch, pt was advised.

## 2020-05-04 NOTE — Patient Instructions (Signed)
Start augmentin Ensure to take probiotics while on antibiotics and also for 2 weeks after completion. It is important to re-colonize the gut with good bacteria and also to prevent any diarrheal infections associated with antibiotic use.   Please continue not to drive  Ask Dr Epimenio Foot if I can order same Cpap and settings OR if you need sleep study test.

## 2020-05-04 NOTE — Assessment & Plan Note (Signed)
Not currently wearing. Dr Epimenio Foot has ordered home sleep study.

## 2020-05-05 ENCOUNTER — Encounter: Payer: Self-pay | Admitting: Neurology

## 2020-05-05 ENCOUNTER — Ambulatory Visit: Payer: Commercial Managed Care - PPO | Admitting: Neurology

## 2020-05-05 VITALS — BP 146/92 | HR 76 | Ht 71.0 in | Wt 238.6 lb

## 2020-05-05 DIAGNOSIS — G4733 Obstructive sleep apnea (adult) (pediatric): Secondary | ICD-10-CM | POA: Diagnosis not present

## 2020-05-05 DIAGNOSIS — Z8603 Personal history of neoplasm of uncertain behavior: Secondary | ICD-10-CM | POA: Diagnosis not present

## 2020-05-05 DIAGNOSIS — Z9889 Other specified postprocedural states: Secondary | ICD-10-CM

## 2020-05-05 DIAGNOSIS — R404 Transient alteration of awareness: Secondary | ICD-10-CM | POA: Diagnosis not present

## 2020-05-05 NOTE — Progress Notes (Signed)
[facility]  PATIENT: Calvin Mcbride DOB: April 12, 1986  REFERRING DOCTOR OR PCP: Rennie Plowman, FNP SOURCE: Patient, wife, notes from the emergency room, laboratory results, imaging reports, CT scan images personally reviewed.  _________________________________   HISTORICAL  CHIEF COMPLAINT:  Chief Complaint  Patient presents with  . New Patient (Initial Visit)    "I had an incident last week where I LOC and I don't know if I had a seizure or not" Room 13, wife Calvin Bumps in room    HISTORY OF PRESENT ILLNESS:  I had the pleasure of seeing your patient, Calvin Mcbride, at [facility] for neurologic consultation regarding his episode of transient alteration of awareness.  He is a 35 year old man who had an episode of altered consciousness 04/29/2020.   He was in his shop near his home.  He recalls walking out of the shop and next remembering being in his living room.   Looking at timing of texts between him and his wife, he lost about 12 minutes.    There were no witnesses to what occurred outside of the shop or the house..  He believes he fell after he left the shop as he did have grass stains and had a facial abrasion consistent with falling to the gravel.  However, it is not known if he lost consciousness and then fell or if he fell and then had the altered awareness.  His wife heard him come into the house and saw him a couple minutes later.  She noted the facial abrasions.   She started asking him questions but he did not know what had happened.  He does not recall these questions.   About 15 minutes later he began to be more clear and he has memory from that point on.  He went to the ED and had labs, EKG and CT scan.    Lab tests were essentially normal.  The CT scan showed sequela of a previous large frontal meningioma resection.  There was no definite acute findings.  He was placed on Keppra 500 mg po bid.     He notes that morning he had  taken Mucinex-DM as he had URI/sinus symptoms.     Once in high school he passed out but was felt to be dehydrated.  He was also groggy a short time after he passed ou.  The event was witnessed and he had no seizure activity.    A few years back, he had the noravirus and had severe nausea and diarrhea.  He felt lightheaded and briefly passed out after using the bathroom.  No seizure activity.    He had some visual changes and was noted to have papilledema in January 2020.   MRI of the brain showed a large left paramedian anterior frontal falcine meningioma-measuring 5.2 cm in longest diameter..   He had a resection at [facility] and recovererd well.  He has had a couple of imaging studies since.  I do not have the actual images but the report does state bilateral encephalomalacia/gliosis.  There has been unexpected resorption of the bone flap that is being followed.  Images reviewed: MRI of the brain 05/10/2018 shows a large left frontal falx seen meningioma measuring 5.2 cm in longest diameter.  There is adjacent left frontal mass-effect and left to right shift.  CT scan from 04/29/2020 CT shows resection of the meningioma.  There is encephalomalacia bilaterally, left much greater than right.  He has evidence of a bifrontal  craniotomy.  There is resumption of some of the bone flap.  REVIEW OF SYSTEMS: Constitutional: No fevers, chills, sweats, or change in appetite Eyes: No visual changes, double vision, eye pain Ear, nose and throat: No hearing loss, ear pain, nasal congestion, sore throat Cardiovascular: No chest pain, palpitations Respiratory: No shortness of breath at rest or with exertion.   No wheezes GastrointestinaI: No nausea, vomiting, diarrhea, abdominal pain, fecal incontinence Genitourinary: No dysuria, urinary retention or frequency.  No nocturia. Musculoskeletal: No neck pain, back pain Integumentary: No rash, pruritus, skin lesions Neurological: as above Psychiatric: No depression at  this time.  No anxiety Endocrine: No palpitations, diaphoresis, change in appetite, change in weigh or increased thirst Hematologic/Lymphatic: No anemia, purpura, petechiae. Allergic/Immunologic: No itchy/runny eyes, nasal congestion, recent allergic reactions, rashes  ALLERGIES: No Known Allergies  HOME MEDICATIONS:  Current Outpatient Medications:  .  amoxicillin-clavulanate (AUGMENTIN) 875-125 MG tablet, Take 1 tablet by mouth 2 (two) times daily for 7 days., Disp: 14 tablet, Rfl: 0 .  levETIRAcetam (KEPPRA) 500 MG tablet, Take 500 mg by mouth 2 (two) times daily., Disp: , Rfl:  .  oseltamivir (TAMIFLU) 75 MG capsule, Take 1 capsule (75 mg total) by mouth 2 (two) times daily., Disp: 10 capsule, Rfl: 0  PAST MEDICAL HISTORY: Past Medical History:  Diagnosis Date  . Chicken pox   . Kidney stones   . OSA (obstructive sleep apnea)    per sleep study 8 or 12/2016 Sleep [facility]     PAST SURGICAL HISTORY: Past Surgical History:  Procedure Laterality Date  . Meningioma Left 2020   brain  . TONSILLECTOMY AND ADENOIDECTOMY      FAMILY HISTORY: Family History  Problem Relation Age of Onset  . Heart disease Maternal Grandfather   . Colon cancer Neg Hx   . Prostate cancer Neg Hx     SOCIAL HISTORY:  Social History   Socioeconomic History  . Marital status: Married    Spouse name: Calvin Mcbride  . Number of children: Not on file  . Years of education: Not on file  . Highest education level: Not on file  Occupational History  . Occupation: Full time  Tobacco Use  . Smoking status: Never Smoker  . Smokeless tobacco: Never Used  Vaping Use  . Vaping Use: Never used  Substance and Sexual Activity  . Alcohol use: Yes    Comment: Occasional  . Drug use: No  . Sexual activity: Yes  Other Topics Concern  . Not on file  Social History Narrative   Lives with wife and 2 kids   Right Handed   Drinks 4-5 cups daily caffeine   Social Determinants of Health   Financial Resource  Strain: Not on file  Food Insecurity: Not on file  Transportation Needs: Not on file  Physical Activity: Not on file  Stress: Not on file  Social Connections: Not on file  Intimate Partner Violence: Not on file     PHYSICAL EXAM  Vitals:   05/05/20 1440  BP: (!) 146/92  Pulse: 76  Weight: 238 lb 9.6 oz (108.2 kg)  Height: 5\' 11"  (1.803 m)    Body mass index is 33.28 kg/m.   General: The patient is well-developed and well-nourished and in no acute distress  HEENT: He has evidence of the bifrontal craniotomy.  Medially, there is a dip with the bone flap is not complete...  Sclera are anicteric.  Funduscopic exam shows normal optic discs and retinal vessels.  Neck: No carotid  bruits are noted.  The neck is nontender.  Cardiovascular: The heart has a regular rate and rhythm with a normal S1 and S2. There were no murmurs, gallops or rubs.    Skin: Extremities are without rash or  edema.  Musculoskeletal:  Back is nontender  Neurologic Exam  Mental status: The patient is alert and oriented x 3 at the time of the examination. The patient has apparent normal recent and remote memory, with an apparently normal attention span and concentration ability.   Speech is normal.  Cranial nerves: Extraocular movements are full. Pupils are equal, round, and reactive to light and accomodation.  Visual fields are full.  Facial symmetry is present. There is good facial sensation to soft touch bilaterally.Facial strength is normal.  Trapezius and sternocleidomastoid strength is normal. No dysarthria is noted.  The tongue is midline, and the patient has symmetric elevation of the soft palate. No obvious hearing deficits are noted.  Motor:  Muscle bulk is normal.   Tone is normal. Strength is  5 / 5 in all 4 extremities.   Sensory: Sensory testing is intact to pinprick, soft touch and vibration sensation in all 4 extremities.  Coordination: Cerebellar testing reveals good finger-nose-finger and  heel-to-shin bilaterally.  Gait and station: Station is normal.   Gait is normal. Tandem gait is normal. Romberg is negative.   Reflexes: Deep tendon reflexes are symmetric and normal bilaterally.   Plantar responses are flexor.    DIAGNOSTIC DATA (LABS, IMAGING, TESTING) - I reviewed patient records, labs, notes, testing and imaging myself where available.  Lab Results  Component Value Date   WBC 11.8 (H) 04/29/2020   HGB 15.6 04/29/2020   HCT 47.7 04/29/2020   MCV 87.8 04/29/2020   PLT 220 04/29/2020      Component Value Date/Time   NA 136 04/29/2020 1340   K 3.6 04/29/2020 1340   CL 100 04/29/2020 1340   CO2 25 04/29/2020 1340   GLUCOSE 112 (H) 04/29/2020 1340   BUN 19 04/29/2020 1340   CREATININE 1.21 04/29/2020 1340   CALCIUM 9.5 04/29/2020 1340   PROT 7.7 03/18/2020 1036   ALBUMIN 4.9 03/18/2020 1036   AST 30 03/18/2020 1036   ALT 51 03/18/2020 1036   ALKPHOS 74 03/18/2020 1036   BILITOT 0.7 03/18/2020 1036   GFRNONAA >60 04/29/2020 1340   Lab Results  Component Value Date   CHOL 228 (H) 03/18/2020   HDL 43.40 03/18/2020   LDLCALC 122 (H) 11/07/2016   LDLDIRECT 166.0 03/18/2020   TRIG 252.0 (H) 03/18/2020   CHOLHDL 5 03/18/2020   Lab Results  Component Value Date   HGBA1C 5.5 03/18/2020   No results found for: VITAMINB12 Lab Results  Component Value Date   TSH 2.96 03/18/2020       ASSESSMENT AND PLAN  Transient alteration of awareness - Plan: EEG adult  History of resection of meningioma - Plan: EEG adult  OSA (obstructive sleep apnea) - Plan: Home sleep test   In summary, Mr. Hollenbeck is a 35 year old man with a history of large meningioma that was resected in early 2020 who had an episode of transient alteration of awareness.  It appears as though he did fall outside but it is uncertain whether the fall was preceded by loss of consciousness or if the altered awareness occurred after the fall.  Regardless, even after he walked back by himself  he had altered awareness for an additional 15 minutes.  He was placed on Keppra 500  mg twice daily in the emergency room.  I had a discussion with him and his wife that I feel the likelihood that he had a seizure is fairly high as he has a large focus of gliosis that could serve as an epileptic focus as a sequela of his meningioma resection.  We will check an EEG to determine if he has any abnormal spikes or sharp waves.  If he does, I would like him to increase the Keppra dose.  If he does not we will continue the dose of 500 mg p.o. twice daily.  He has an MRI of the brain scheduled shortly in [place].  We discussed the state law that he should not drive for 6 months.  He also has weakness obstructive sleep apnea.  He had a sleep study a few years ago.  I can only find evidence of the CPAP titration (successfully titrated to CPAP 7 cm).  He believes he only had the 1 study.  We will do a home sleep study.  If he does have significant sleep apnea I will set up AutoPap. He will return to see me in 3 to 4 months or sooner if there are new or worsening neurologic symptoms.   Thank you for asking me to see Mr. Miyoshi.  Please let me know if I can be of further assistance with him or other patients in the future.  Lulamae Skorupski A. Epimenio Foot, MD, [facility] 05/05/2020, 2:48 PM Certified in Neurology, Clinical Neurophysiology, Sleep Medicine and Neuroimaging  [facility] [address] [place], [place] [address] [phone]

## 2020-05-06 NOTE — Assessment & Plan Note (Signed)
Etiology for syncope is not known at this time. Particularly complex as patient reports confusion prior and after syncope, syncope was not witnessed. Currently undergoing neurologic evaluation for possible seizure with Dr Epimenio Foot. Patient well appearing today. He is not orthostatic. We discussed possible cardiology evaluation after seeing neurology. Advised not to drive per seizure protocol for 6 months or until released by neurology. MRI brain scheduled by neurosurgery. Close follow up.

## 2020-05-13 ENCOUNTER — Other Ambulatory Visit: Payer: Self-pay

## 2020-05-13 ENCOUNTER — Ambulatory Visit: Payer: Commercial Managed Care - PPO | Admitting: Neurology

## 2020-05-13 DIAGNOSIS — R410 Disorientation, unspecified: Secondary | ICD-10-CM | POA: Diagnosis not present

## 2020-05-13 DIAGNOSIS — R404 Transient alteration of awareness: Secondary | ICD-10-CM

## 2020-05-13 DIAGNOSIS — Z8603 Personal history of neoplasm of uncertain behavior: Secondary | ICD-10-CM

## 2020-05-14 ENCOUNTER — Encounter: Payer: Self-pay | Admitting: *Deleted

## 2020-05-14 NOTE — Progress Notes (Signed)
   [facility]  EEG (ELECTROENCEPHALOGRAM) REPORT   STUDY DATE: 05/13/2020 PATIENT NAME: Calvin Mcbride DOB: [birth year] MRN: [id number]  ORDERING CLINICIAN: Latasha Puskas A. Epimenio Foot, MD. PhD  TECHNOLOGIST: Elvis Coil, RPSGT TECHNIQUE: Electroencephalogram was recorded utilizing standard 10-20 system of lead placement and reformatted into average and bipolar montages.  RECORDING TIME: 26 minutes 10 seconds  CLINICAL INFORMATION: 35 year old man with episode of altered consciousness  FINDINGS: A digital EEG was performed while the patient was awake and drowsy. While awake and most alert there was a 10 hz posterior dominant rhythm. Voltages and frequencies were symmetric.   Intermittent frontal theta was noted.    There were no lateralizing, epileptiform activity or seizures seen.  Photic stimulation had a normal driving response. Hyperventilation and recovery did not change the underlying rhythms.  The patient became drowsy but did not enter definitive sleep.  EKG channel shows normal sinus rhythm.  IMPRESSION: This EEG while the patient was awake and drowsy showed intermittent frontal slowing consistent with mild underlying cerebral dysfunction.  No sharp waves or spikes were noted.   INTERPRETING PHYSICIAN:   Delson Dulworth A. Epimenio Foot, MD, PhD, [facility] Certified in Neurology, Clinical Neurophysiology, Sleep Medicine, Pain Medicine and Neuroimaging  [facility] [address], [address] [place], [place] [address] [phone]

## 2020-05-27 ENCOUNTER — Encounter: Payer: Self-pay | Admitting: Family

## 2020-05-29 ENCOUNTER — Other Ambulatory Visit: Payer: Self-pay | Admitting: Family

## 2020-05-29 DIAGNOSIS — R404 Transient alteration of awareness: Secondary | ICD-10-CM

## 2020-05-29 MED ORDER — LEVETIRACETAM 500 MG PO TABS: 500.0000 mg | ORAL_TABLET | Freq: Two times a day (BID) | ORAL | 0 refills | Status: DC

## 2020-06-01 ENCOUNTER — Ambulatory Visit (INDEPENDENT_AMBULATORY_CARE_PROVIDER_SITE_OTHER): Payer: Commercial Managed Care - PPO | Admitting: Neurology

## 2020-06-01 DIAGNOSIS — G4733 Obstructive sleep apnea (adult) (pediatric): Secondary | ICD-10-CM

## 2020-06-03 NOTE — Progress Notes (Signed)
   [facility]  HOME SLEEP TEST (Watch PAT)  STUDY DATE: 06/01/20  DOB: [birth year]  MRN: [id number]  ORDERING CLINICIAN: Richard A. Epimenio Foot, MD, PhD,FAAN    REFERRING CLINICIAN: Allegra Grana, FNP   CLINICAL INFORMATION/HISTORY: He is a 35 year old man who had an episode of altered consciousness 04/29/2020.    The CT scan showed sequela of a previous large frontal meningioma resection.  There was no definite acute findings.  He was placed on Keppra 500 mg po bid.     He has OSA witnessed by his wife..  He had a PSG in the past and was titrated to CPAP 7 cm.  Epworth sleepiness score: N/A.  BMI: 33.3 kg/m  FINDINGS:   Total Record Time (hours, min): 7 H 58 min  Total Sleep Time (hours, min):  7 H 33 min   Percent REM (%):    24.40 %   Calculated pAHI (per hour): 11.8       REM pAHI: 20.0    NREM pAHI: 9.3 Supine AHI: 12.7   Oxygen Saturation (%) Mean: 94 Minimum oxygen saturation (%):        86   O2 Saturation Range (%): 86-98  O2Saturation (minutes) <=88%: 0.2 min  Pulse Mean (bpm):    58  Pulse Range (42-99)   IMPRESSION: Mild overall OSA (obstructive sleep apnea AHI = 11.8) that was moderate during REM sleep (REM AHI = 20.8)   RECOMMENDATION:  For the OSA, consider CPAP or an oral appliance. Follow-up with Dr. Epimenio Foot.   INTERPRETING PHYSICIAN:  Richard A. Epimenio Foot, MD, [facility] 05/05/2020, 2:48 PM Certified in Neurology, Clinical Neurophysiology, Sleep Medicine and Neuroimaging  [facility] [address] [place], [place] [address] [phone]

## 2020-06-22 ENCOUNTER — Other Ambulatory Visit: Payer: Self-pay | Admitting: Family

## 2020-06-22 DIAGNOSIS — R404 Transient alteration of awareness: Secondary | ICD-10-CM

## 2020-06-27 ENCOUNTER — Other Ambulatory Visit: Payer: Self-pay | Admitting: Family

## 2020-06-27 DIAGNOSIS — R404 Transient alteration of awareness: Secondary | ICD-10-CM

## 2020-06-29 ENCOUNTER — Other Ambulatory Visit: Payer: Self-pay | Admitting: *Deleted

## 2020-06-29 ENCOUNTER — Encounter: Payer: Self-pay | Admitting: Family

## 2020-06-29 DIAGNOSIS — R404 Transient alteration of awareness: Secondary | ICD-10-CM

## 2020-06-29 MED ORDER — LEVETIRACETAM 500 MG PO TABS: 500.0000 mg | ORAL_TABLET | Freq: Two times a day (BID) | ORAL | 0 refills | Status: DC

## 2020-07-03 ENCOUNTER — Ambulatory Visit: Payer: Commercial Managed Care - PPO | Admitting: Family

## 2020-09-02 ENCOUNTER — Other Ambulatory Visit: Payer: Self-pay

## 2020-09-02 ENCOUNTER — Ambulatory Visit: Payer: Commercial Managed Care - PPO | Admitting: Neurology

## 2020-09-02 ENCOUNTER — Encounter: Payer: Self-pay | Admitting: Neurology

## 2020-09-02 VITALS — BP 134/81 | HR 58 | Ht 71.0 in | Wt 238.0 lb

## 2020-09-02 DIAGNOSIS — R404 Transient alteration of awareness: Secondary | ICD-10-CM

## 2020-09-02 DIAGNOSIS — Z9889 Other specified postprocedural states: Secondary | ICD-10-CM

## 2020-09-02 DIAGNOSIS — Z8603 Personal history of neoplasm of uncertain behavior: Secondary | ICD-10-CM | POA: Diagnosis not present

## 2020-09-02 DIAGNOSIS — G4733 Obstructive sleep apnea (adult) (pediatric): Secondary | ICD-10-CM

## 2020-09-02 MED ORDER — LEVETIRACETAM 500 MG PO TABS: 500.0000 mg | ORAL_TABLET | Freq: Two times a day (BID) | ORAL | 3 refills | Status: DC

## 2020-09-02 NOTE — Progress Notes (Signed)
[facility]  PATIENT: Calvin Mcbride DOB: [birth year]  REFERRING DOCTOR OR PCP: Rennie Plowman, FNP SOURCE: Patient, wife, notes from the emergency room, laboratory results, imaging reports, CT scan images personally reviewed.  _________________________________   HISTORICAL  CHIEF COMPLAINT:  Chief Complaint  Patient presents with  . Follow-up    RM 12, alone. Last seen 05/05/20. On Keppra for seizures/no seizures since last seen. 4pm ready in room 12. Most insurances will not cover mouth guards unless the patient has tried/failed cpap first. He is wanting to discuss this.    HISTORY OF PRESENT ILLNESS:  Calvin Mcbride is a 35 yo man with a possible seizure and h/o large meningioma resection with subsequent frontal encephalomalacia.  Marland Kitchen  Update 09/02/2020 He is on Keppra and is tolerating it well.   He is tolerating it well now.       Sleep study 06/01/2020 showed mild OSA that was moderate during REM sleep (AHI equals 20).  He used a self-molded oral appliance with benefit in the past his wife noted that his OSA and snoring resolved.  However, the appliance was uncomfortable.  He spoke to his dentist about getting a custom oral appliance made.  Unfortunately his insurance denied the request.   He had had a CPAP trial in 01/16/2017 but did not find CPAP comfortable.   He feels his compliance would be better with an oral appliance than a CPAP.   I have asked her to forward a copy of the denial and we will write an appeal.      Seizure History: He had an episode of altered consciousness 04/29/2020.   He was in his shop near his home.  He recalls walking out of the shop and next remembering being in his living room.   Looking at timing of texts between him and his wife, he lost about 12 minutes.    There were no witnesses to what occurred outside of the shop or the house..  He believes he fell after he left the shop as he did have grass stains and had a facial  abrasion consistent with falling to the gravel.  However, it is not known if he lost consciousness and then fell or if he fell and then had the altered awareness.  His wife heard him come into the house and saw him a couple minutes later.  She noted the facial abrasions.   She started asking him questions but he did not know what had happened.  He does not recall these questions.   About 15 minutes later he began to be more clear and he has memory from that point on.  He went to the ED and had labs, EKG and CT scan.    Lab tests were essentially normal.  The CT scan showed sequela of a previous large frontal meningioma resection.  There was no definite acute findings.  He was placed on Keppra 500 mg po bid.     Meningioma history:  He had some visual changes and was noted to have papilledema in January 2020.   MRI of the brain showed a large left paramedian anterior frontal falcine meningioma-measuring 5.2 cm in longest diameter..   He had a resection at [facility] and recovererd well.  He has had a couple of imaging studies since.  I do not have the actual images but the report does state bilateral encephalomalacia/gliosis.  There has been unexpected resorption of the bone flap that is being followed.  Images reviewed:  MRI of the brain 05/10/2018 shows a large left frontal falx seen meningioma measuring 5.2 cm in longest diameter.  There is adjacent left frontal mass-effect and left to right shift.  CT scan from 04/29/2020 CT shows resection of the meningioma.  There is encephalomalacia bilaterally, left much greater than right.  He has evidence of a bifrontal craniotomy.  There is resumption of some of the bone flap.  REVIEW OF SYSTEMS: Constitutional: No fevers, chills, sweats, or change in appetite Eyes: No visual changes, double vision, eye pain Ear, nose and throat: No hearing loss, ear pain, nasal congestion, sore throat Cardiovascular: No chest pain, palpitations Respiratory: No shortness of breath at  rest or with exertion.   No wheezes GastrointestinaI: No nausea, vomiting, diarrhea, abdominal pain, fecal incontinence Genitourinary: No dysuria, urinary retention or frequency.  No nocturia. Musculoskeletal: No neck pain, back pain Integumentary: No rash, pruritus, skin lesions Neurological: as above Psychiatric: No depression at this time.  No anxiety Endocrine: No palpitations, diaphoresis, change in appetite, change in weigh or increased thirst Hematologic/Lymphatic: No anemia, purpura, petechiae. Allergic/Immunologic: No itchy/runny eyes, nasal congestion, recent allergic reactions, rashes  ALLERGIES: No Known Allergies  HOME MEDICATIONS:  Current Outpatient Medications:  .  levETIRAcetam (KEPPRA) 500 MG tablet, Take 1 tablet (500 mg total) by mouth 2 (two) times daily., Disp: 180 tablet, Rfl: 3  PAST MEDICAL HISTORY: Past Medical History:  Diagnosis Date  . Chicken pox   . Kidney stones   . OSA (obstructive sleep apnea)    per sleep study 8 or 12/2016 Sleep Med     PAST SURGICAL HISTORY: Past Surgical History:  Procedure Laterality Date  . Meningioma Left 2020   brain  . TONSILLECTOMY AND ADENOIDECTOMY      FAMILY HISTORY: Family History  Problem Relation Age of Onset  . Heart disease Maternal Grandfather   . Colon cancer Neg Hx   . Prostate cancer Neg Hx     SOCIAL HISTORY:  Social History   Socioeconomic History  . Marital status: Married    Spouse name: Shanda Bumps  . Number of children: Not on file  . Years of education: Not on file  . Highest education level: Not on file  Occupational History  . Occupation: Full time  Tobacco Use  . Smoking status: Never Smoker  . Smokeless tobacco: Never Used  Vaping Use  . Vaping Use: Never used  Substance and Sexual Activity  . Alcohol use: Yes    Comment: Occasional  . Drug use: No  . Sexual activity: Yes  Other Topics Concern  . Not on file  Social History Narrative   Lives with wife and 2 kids    Right Handed   Drinks 4-5 cups daily caffeine   Social Determinants of Health   Financial Resource Strain: Not on file  Food Insecurity: Not on file  Transportation Needs: Not on file  Physical Activity: Not on file  Stress: Not on file  Social Connections: Not on file  Intimate Partner Violence: Not on file     PHYSICAL EXAM  Vitals:   09/02/20 1515  BP: 134/81  Pulse: (!) 58  Weight: 238 lb (108 kg)  Height: 5\' 11"  (1.803 m)    Body mass index is 33.19 kg/m.   General: The patient is well-developed and well-nourished and in no acute distress  HEENT: He has evidence of the bifrontal craniotomy.  Medially, there is a dip where the bone flap is not complete...  Sclera are anicteric.  Skin: Extremities are without rash or  edema.  Musculoskeletal:  Back is nontender  Neurologic Exam  Mental status: The patient is alert and oriented x 3 at the time of the examination. The patient has apparent normal recent and remote memory, with an apparently normal attention span and concentration ability.   Speech is normal.  Cranial nerves: Extraocular movements are full. Pupils are equal, round, and reactive to light and accomodation.  Visual fields are full.  Facial symmetry is present. There is good facial sensation to soft touch bilaterally.Facial strength is normal.  Trapezius and sternocleidomastoid strength is normal. No dysarthria is noted.  The tongue is midline, and the patient has symmetric elevation of the soft palate. No obvious hearing deficits are noted.  Motor:  Muscle bulk is normal.   Tone is normal. Strength is  5 / 5 in all 4 extremities.   Sensory: Sensory testing is intact to pinprick, soft touch and vibration sensation in all 4 extremities.  Coordination: Cerebellar testing reveals good finger-nose-finger and heel-to-shin bilaterally.  Gait and station: Station is normal.   Gait is normal. Tandem gait is normal. Romberg is negative.   Reflexes: Deep tendon  reflexes are symmetric and normal bilaterally.      DIAGNOSTIC DATA (LABS, IMAGING, TESTING) - I reviewed patient records, labs, notes, testing and imaging myself where available.  Lab Results  Component Value Date   WBC 11.8 (H) 04/29/2020   HGB 15.6 04/29/2020   HCT 47.7 04/29/2020   MCV 87.8 04/29/2020   PLT 220 04/29/2020      Component Value Date/Time   NA 136 04/29/2020 1340   K 3.6 04/29/2020 1340   CL 100 04/29/2020 1340   CO2 25 04/29/2020 1340   GLUCOSE 112 (H) 04/29/2020 1340   BUN 19 04/29/2020 1340   CREATININE 1.21 04/29/2020 1340   CALCIUM 9.5 04/29/2020 1340   PROT 7.7 03/18/2020 1036   ALBUMIN 4.9 03/18/2020 1036   AST 30 03/18/2020 1036   ALT 51 03/18/2020 1036   ALKPHOS 74 03/18/2020 1036   BILITOT 0.7 03/18/2020 1036   GFRNONAA >60 04/29/2020 1340   Lab Results  Component Value Date   CHOL 228 (H) 03/18/2020   HDL 43.40 03/18/2020   LDLCALC 122 (H) 11/07/2016   LDLDIRECT 166.0 03/18/2020   TRIG 252.0 (H) 03/18/2020   CHOLHDL 5 03/18/2020   Lab Results  Component Value Date   HGBA1C 5.5 03/18/2020   No results found for: VITAMINB12 Lab Results  Component Value Date   TSH 2.96 03/18/2020       ASSESSMENT AND PLAN  History of resection of meningioma  Transient alteration of awareness - Plan: levETIRAcetam (KEPPRA) 500 MG tablet  OSA (obstructive sleep apnea)  1.   Continue Keppra.  We discussed that if he goes seizure-free for a couple years we could consider stopping the medication to see if he would do well without it. 2.   He has OSA.  An oral appliance helps his symptoms but the non-custom one was of poor quality and not comfortable.  He would do best with 1 professionally made by dentist.  Unfortunately, his insurance would not cover this.  He will forward the denial letter and we will see if we can get approval 3.   Return in 1 year or sooner if there are new or worsening neurologic symptoms.  Rhya Shan A. Epimenio Foot, MD, [facility]  09/02/2020, 8:34 PM Certified in Neurology, Clinical Neurophysiology, Sleep Medicine and Neuroimaging  [facility]  [address], [place], [place] [address] [phone]

## 2020-09-02 NOTE — Patient Instructions (Signed)
I able to do things without a laptop at the office

## 2020-09-03 NOTE — Telephone Encounter (Signed)
Called Dr. Myles Lipps office at [phone] and spoke w/ Fatima Blank. She verified they file through medical insurance, not dental. They would need referral to include rx for device, office notes, current sleep study. Fax#[phone]. They would schedule pt for a NP appt first. He would have to meet deductible via insurance first then there is a 20 or 30 percent coinsurance cost.   I also called Dr. Dr. Irene Limbo at [phone]. Their office is closed until 09/07/20.

## 2021-07-11 IMAGING — CT CT HEAD W/O CM
4 series · 15 of 47 positions shown, 17 images · non-contrast
Comparison: Prior MRI from 05/10/2018.

CLINICAL DATA: Initial evaluation for acute seizure.

EXAM:
CT HEAD WITHOUT CONTRAST
TECHNIQUE: Contiguous axial images were obtained from the base of the skull
through the vertex without intravenous contrast.

[Series 3: head without · axial · non-contrast · 0.44mm/px · z∈[-102,+23]mm · 7 of 35 slices shown, 9 images]
[im 5/35  brain]
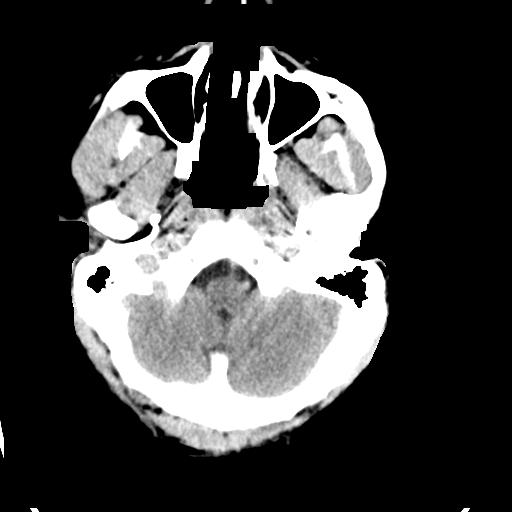
[im 5/35  bone]
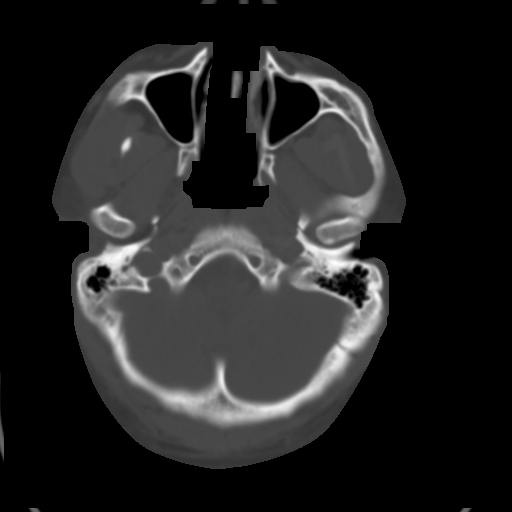
[im 9/35  brain]
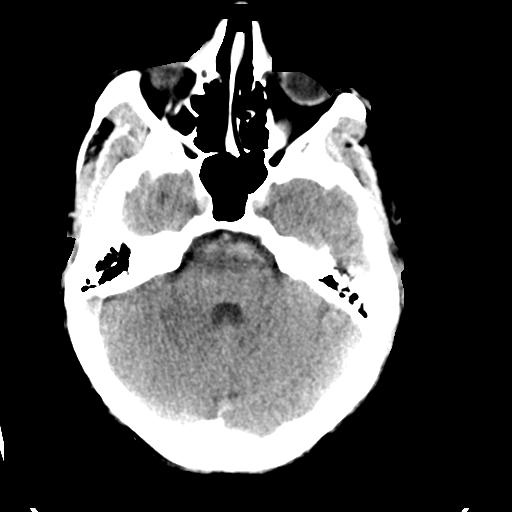
[im 13/35  brain]
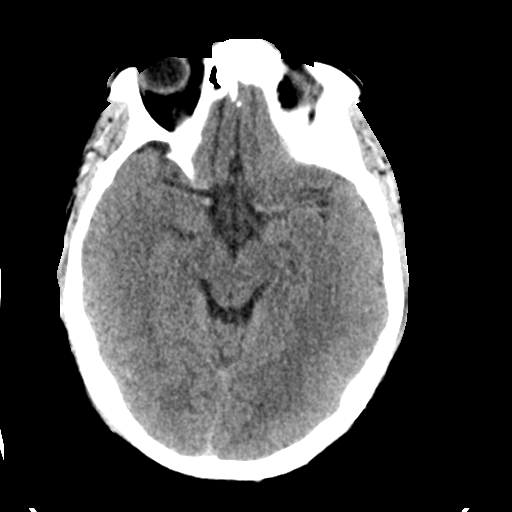
[im 18/35  brain]
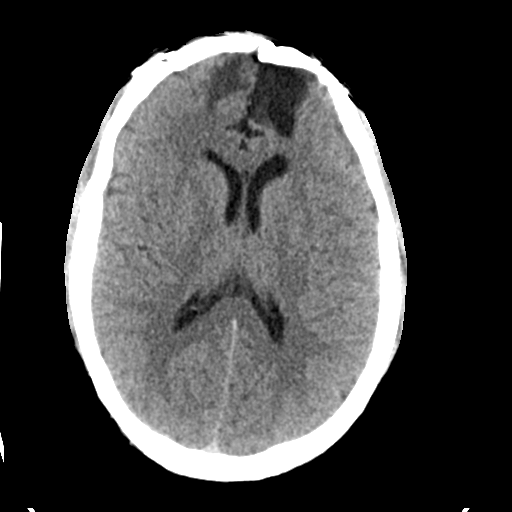
[im 22/35  brain]
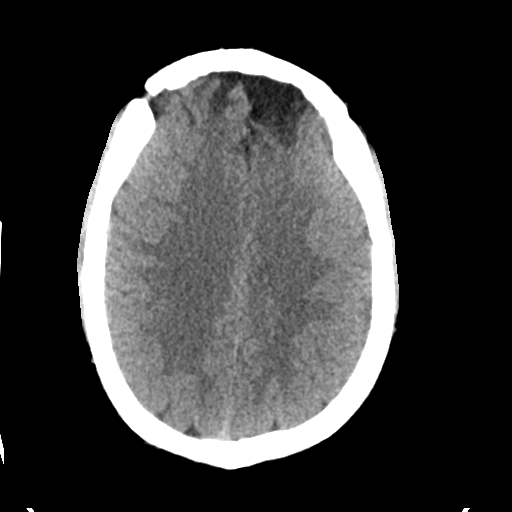
[im 22/35  bone]
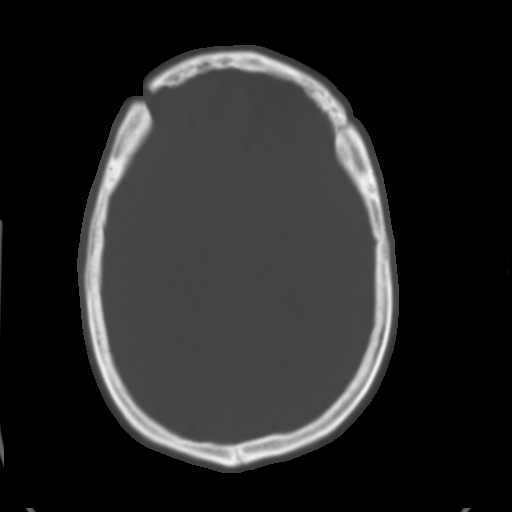
[im 26/35  brain]
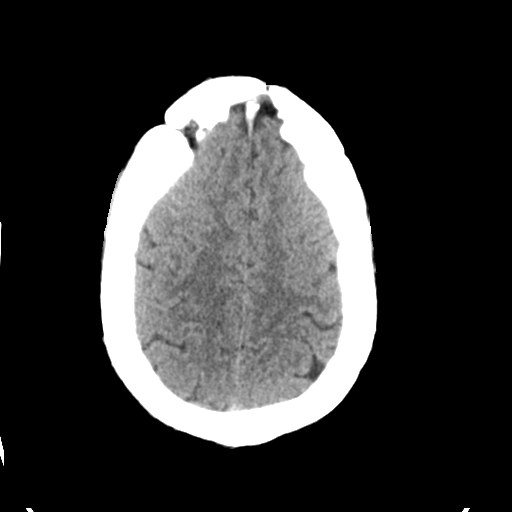
[im 30/35  brain]
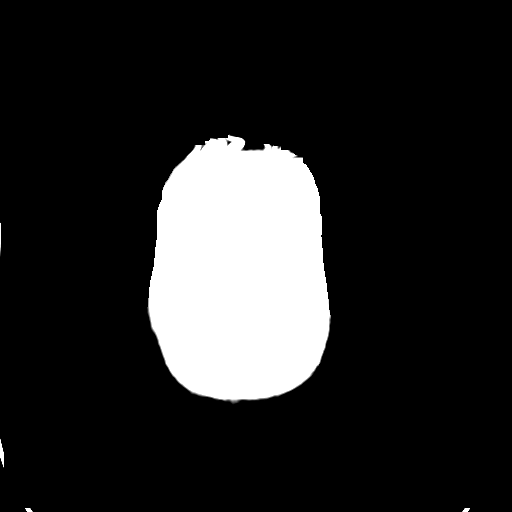

[Series 4: head bone · axial · 0.44mm/px · z∈[-106,-88]mm · 2 of 86 slices shown]
[im 9/86  bone]
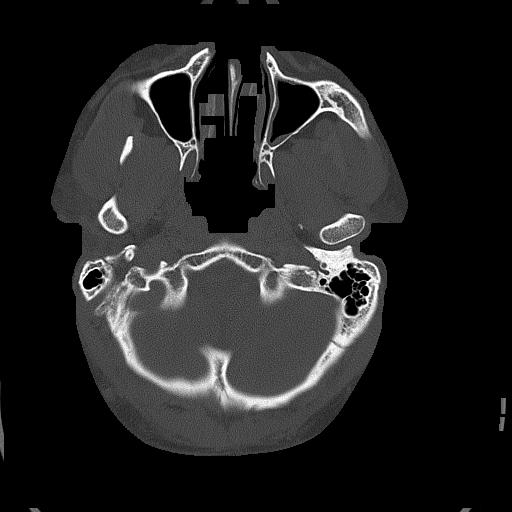
[im 18/86  bone]
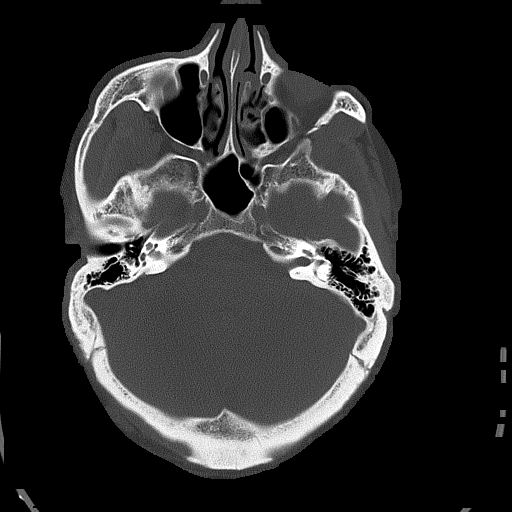

[Series 5: head without cor · coronal · non-contrast · 0.33mm/px · 3 of 74 slices shown]
[im 25/74  brain]
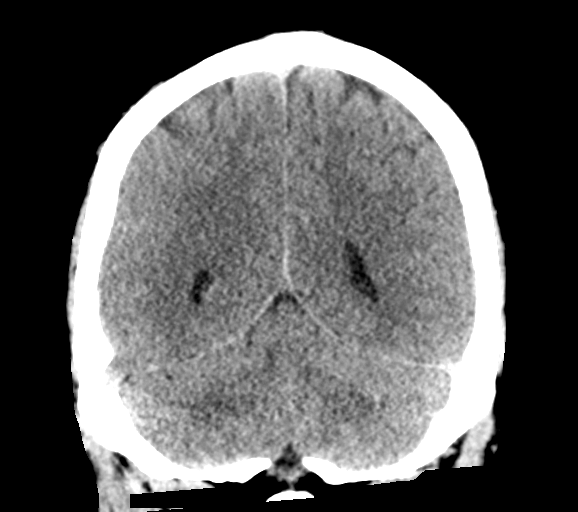
[im 33/74  brain]
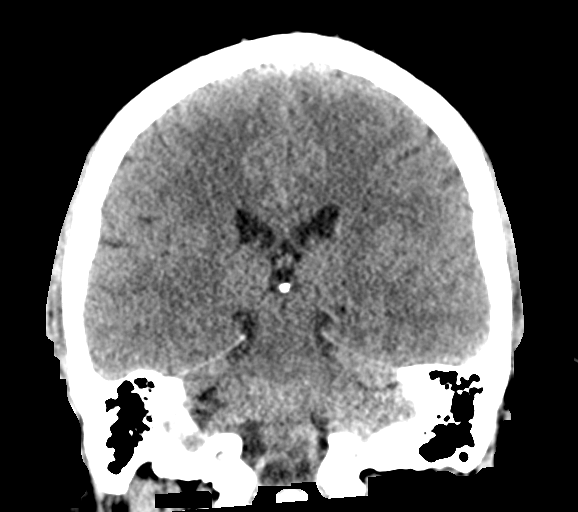
[im 41/74  brain]
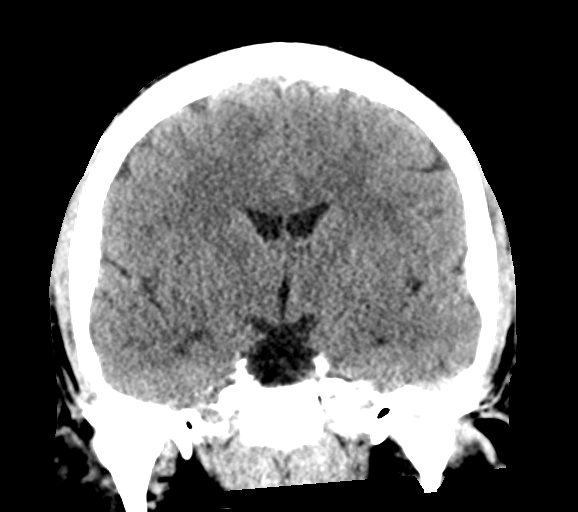

[Series 6: head without sag · sagittal · non-contrast · 0.33mm/px · 3 of 65 slices shown]
[im 22/65  brain]
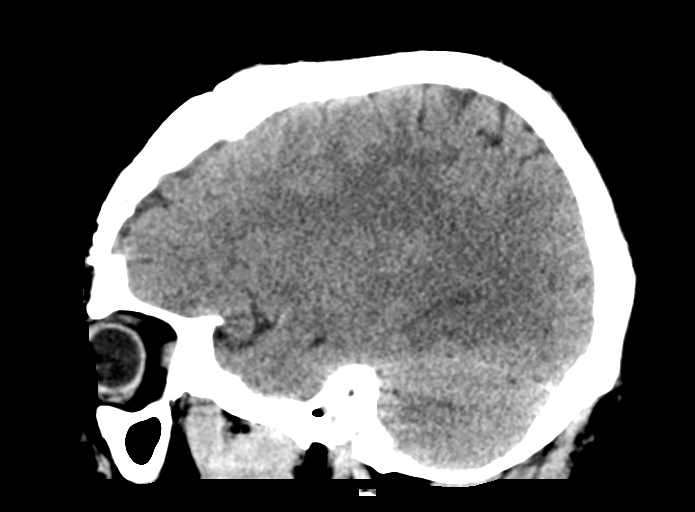
[im 33/65  brain]
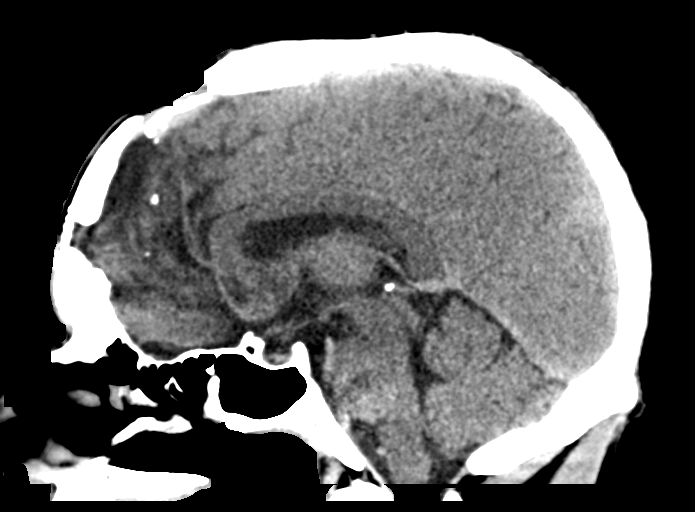
[im 43/65  brain]
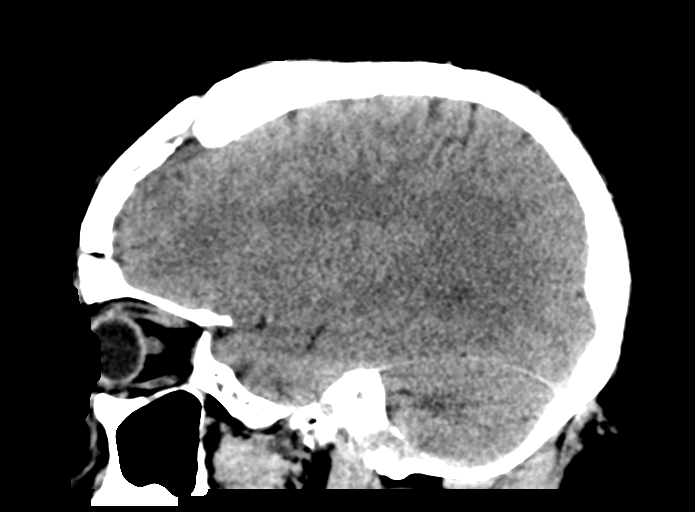

[15 of 47 positions shown; findings below may reference images not displayed]

FINDINGS: Brain: Postoperative changes from previous bifrontal craniotomy for
resection of previously seen left frontal meningioma. Postoperative
encephalomalacia of present at the resection bed at the parasagittal
anterior left frontal lobe. There is additional hypodensity at the
adjacent parasagittal anterior right frontal lobe (series 3, image
19), somewhat more indeterminate in appearance. While this finding
may also reflect postoperative encephalomalacia, a small amount of
vasogenic edema related to local recurrent tumor is difficult to
exclude. No obvious tumor visible on this noncontrast CT
examination. No significant regional mass effect or midline shift.

Remainder the brain is normal in appearance. No acute intracranial
hemorrhage. No other large vessel territory infarct. No
hydrocephalus or extra-axial fluid collection.

Vascular: No hyperdense vessel.

Skull: Previous bifrontal craniotomy. No other acute or significant
finding.

Sinuses/Orbits: Globes and orbital soft tissues within normal
limits. Mild scattered mucosal thickening noted within the ethmoidal
air cells and maxillary sinuses. Paranasal sinuses are otherwise
clear. No mastoid effusion.

Other: None.
IMPRESSION: 1. Postoperative changes from bifrontal craniotomy for resection of
previously seen left frontal meningioma. Postoperative
encephalomalacia at the resection bed at the parasagittal anterior
left frontal lobe.
2. Additional hypodensity at the adjacent parasagittal anterior
right frontal lobe, somewhat more indeterminate in appearance. While
this finding may also reflect postoperative changes, a small amount
of vasogenic edema related to locally recurrent tumor is difficult
to exclude. Follow-up examination with dedicated MRI, with and
without contrast, suggested for further evaluation.
3. No other acute intracranial abnormality.

## 2021-09-02 ENCOUNTER — Ambulatory Visit: Payer: Commercial Managed Care - PPO | Admitting: Neurology

## 2021-09-21 ENCOUNTER — Other Ambulatory Visit: Payer: Self-pay | Admitting: Neurology

## 2021-09-21 DIAGNOSIS — R404 Transient alteration of awareness: Secondary | ICD-10-CM

## 2021-10-18 ENCOUNTER — Ambulatory Visit: Payer: Commercial Managed Care - PPO | Admitting: Neurology

## 2021-10-18 ENCOUNTER — Encounter: Payer: Self-pay | Admitting: Neurology

## 2021-10-18 VITALS — BP 135/78 | HR 54 | Ht 71.0 in | Wt 216.0 lb

## 2021-10-18 DIAGNOSIS — Z9889 Other specified postprocedural states: Secondary | ICD-10-CM | POA: Diagnosis not present

## 2021-10-18 DIAGNOSIS — Z8603 Personal history of neoplasm of uncertain behavior: Secondary | ICD-10-CM

## 2021-10-18 DIAGNOSIS — R404 Transient alteration of awareness: Secondary | ICD-10-CM | POA: Diagnosis not present

## 2021-10-18 DIAGNOSIS — G4733 Obstructive sleep apnea (adult) (pediatric): Secondary | ICD-10-CM

## 2021-10-18 MED ORDER — LEVETIRACETAM 500 MG PO TABS: 500.0000 mg | ORAL_TABLET | Freq: Two times a day (BID) | ORAL | 4 refills | Status: DC

## 2021-10-20 ENCOUNTER — Encounter: Payer: Self-pay | Admitting: Family

## 2021-10-22 ENCOUNTER — Other Ambulatory Visit: Payer: Self-pay

## 2021-10-22 ENCOUNTER — Telehealth: Payer: Self-pay

## 2021-10-22 DIAGNOSIS — Z3009 Encounter for other general counseling and advice on contraception: Secondary | ICD-10-CM

## 2021-10-22 NOTE — Telephone Encounter (Signed)
LVM to call back to inform patient that referral has been placed to Urology. He can call to schedule appt

## 2021-10-22 NOTE — Telephone Encounter (Signed)
LVM to call back to inform patient that referral has been placed to Urology. He can call to schedule appt 

## 2021-10-22 NOTE — Progress Notes (Signed)
referral

## 2021-10-27 NOTE — Telephone Encounter (Signed)
LVM to let patient know that referral to Urology had been placed

## 2021-10-28 NOTE — Telephone Encounter (Signed)
Spoke to patient and let him know the urology referral has been placed

## 2021-11-18 ENCOUNTER — Encounter: Payer: Self-pay | Admitting: Urology

## 2021-11-18 ENCOUNTER — Ambulatory Visit: Payer: Commercial Managed Care - PPO | Admitting: Urology

## 2021-11-18 VITALS — BP 143/89 | HR 50 | Ht 71.0 in | Wt 215.0 lb

## 2021-11-18 DIAGNOSIS — Z3009 Encounter for other general counseling and advice on contraception: Secondary | ICD-10-CM

## 2021-11-18 DIAGNOSIS — N200 Calculus of kidney: Secondary | ICD-10-CM

## 2021-11-18 MED ORDER — DIAZEPAM 5 MG PO TABS: 5.0000 mg | ORAL_TABLET | Freq: Once | ORAL | 0 refills | Status: DC | PRN

## 2021-11-18 NOTE — Patient Instructions (Addendum)
Pre-Vasectomy Instructions  STOP all aspirin or blood thinners (Aspirin, Plavix, Coumadin, Warfarin, Motrin, Ibuprofen, Advil, Aleve, Naproxen, Naprosyn) for 7 days prior to the procedure.  If you have any questions about stopping these medications please contact your primary care physician or cardiologist.  Shave all hair from the upper scrotum on the day of the procedure.  This means just under the penis onto the scrotal sac.  The area shaved should measure about 2-3 inches around.  You may lather the scrotum with soap and water, and shave with a safety razor.  After shaving the area, thoroughly wash the penis and the scrotum, then shower or bathe to remove all the loose hairs.  If needed, wash the area again just before coming in for your Vasectomy.  It is recommended to have a light meal an hour or so prior to the procedure.  Bring a scrotal support (jock strap or suspensory, or tight jockey shorts or underwear).  Wear comfortable pants or shorts.  While the actual procedure usually takes about 45 minutes, you should be prepared to stay in the office for approximately one hour.  Bring someone with you to drive you home.  If you have any questions or concerns, please feel free to call the office at [phone].     Vasectomy Vasectomy is a procedure in which the vas deferens is cut and then tied or burned (cauterized). The vas deferens is a tube that carries sperm from the testicle to the part of the body that drains urine from the bladder (urethra). This procedure blocks sperm from going through the vas deferens and penis during ejaculation. This ensures that sperm does not go into the vagina during sex. Vasectomy does not affect sexual desire or performance and does not prevent sexually transmitted infections. Vasectomy is considered a permanent and very effective form of birth control (contraception). The decision to have a vasectomy should not be made during a stressful time, such as  after the loss of a pregnancy or a divorce. You and your partner should decide on whether to have a vasectomy when you are sure that you do not want children in the future. Tell a health care provider about: Any allergies you have. All medicines you are taking, including vitamins, herbs, eye drops, creams, and over-the-counter medicines. Any problems you or family members have had with anesthetic medicines. Any blood disorders you have. Any surgeries you have had. Any medical conditions you have. What are the risks? Generally, this is a safe procedure. However, problems may occur, including: Infection. Bleeding and swelling of the scrotum. The scrotum is the sac that contains the testicles, blood vessels, and structures that help deliver sperm and semen. Allergic reactions to medicines. Failure of the procedure to prevent pregnancy. There is a very small chance that the tied or cauterized ends of the vas deferens may reconnect (recanalization). If this happens, you could still make a woman pregnant. Pain in the scrotum that continues after you heal from the procedure. What happens before the procedure? Medicines Ask your health care provider about: Changing or stopping your regular medicines. This is especially important if you are taking diabetes medicines or blood thinners. Taking medicines such as aspirin and ibuprofen. These medicines can thin your blood. Do not take these medicines unless your health care provider tells you to take them. Taking over-the-counter medicines, vitamins, herbs, and supplements. You may be told to take a medicine to help you relax (sedative) a few hours before the procedure. General   instructions Do not use any products that contain nicotine or tobacco for at least 4 weeks before the procedure. These products include cigarettes, e-cigarettes, and chewing tobacco. If you need help quitting, ask your health care provider. Plan to have a responsible adult take you  home from the hospital or clinic. If you will be going home right after the procedure, plan to have a responsible adult care for you for the time you are told. This is important. Ask your health care provider: How your surgery site will be marked. What steps will be taken to help prevent infection. These steps may include: Removing hair at the surgery site. Washing skin with a germ-killing soap. Taking antibiotic medicine. What happens during the procedure?  You will be given one or more of the following: A sedative, unless you were told to take this a few hours before the procedure. A medicine to numb the area (local anesthetic). Your health care provider will feel, or palpate, for your vas deferens. To reach the vas deferens, one of two methods may be used: A very small incision may be made in your scrotum. A punctured opening may be made in your scrotum, without an incision. Your vas deferens will be pulled out of your scrotum and cut. Then, the vas deferens will be closed in one of two ways: Tied at the ends. Cauterized at the ends to seal them off. The vas deferens will be put back into your scrotum. The incision or puncture opening will be closed with absorbable stitches (sutures). The sutures will eventually dissolve and will not need to be removed after the procedure. The procedure will be repeated on the other side of your scrotum. The procedure may vary among health care providers and hospitals. What happens after the procedure? You will be monitored to make sure that you do not have problems. You will be asked not to ejaculate for at least 1 week after the procedure, or for as long as you are told. You will need to use a different form of contraception for 2-4 months after the procedure, until you have test results confirming that there are no sperm in your semen. You may be given scrotal support to wear, such as a jockstrap or underwear with a supportive pouch. If you were  given a sedative during the procedure, it can affect you for several hours. Do not drive or operate machinery until your health care provider says that it is safe. Summary Vasectomy blocks sperm from being released during ejaculation. This procedure is considered a permanent and very effective form of birth control. Your scrotum will be numbed with medicine (local anesthetic) for the procedure. After the procedure, you will be asked not to ejaculate for at least 1 week, or for as long as you are told. You will also need to use a different form of contraception until your test results confirm that there are no sperm in your semen. This information is not intended to replace advice given to you by your health care provider. Make sure you discuss any questions you have with your health care provider. Document Revised: 08/29/2019 Document Reviewed: 08/29/2019 Elsevier Patient Education  2023 [employer]  

## 2021-11-18 NOTE — Progress Notes (Signed)
   11/18/21 9:37 AM   Calvin Mcbride [facility] 08-04-1985 [id number]  CC: Discuss vasectomy, history nephrolithiasis  HPI: 36 year old male who I saw previously in January 2020 for a 2 mm spontaneously passed stone.  He had no other stones on CT at that time, and has not had any stone episodes since that time.  He is here today to discuss vasectomy.  He has children ages 22 and 57, and he and his wife are not interested in other biologic pregnancies.   PMH: Past Medical History:  Diagnosis Date   Chicken pox    Kidney stones    OSA (obstructive sleep apnea)    per sleep study 8 or 12/2016 Sleep Med      Family History: Family History  Problem Relation Age of Onset   Heart disease Maternal Grandfather    Colon cancer Neg Hx    Prostate cancer Neg Hx     Social History:  reports that he has never smoked. He has never used smokeless tobacco. He reports current alcohol use. He reports that he does not use drugs.  Physical Exam: BP (!) 143/89 (BP Location: Left Arm, Patient Position: Sitting, Cuff Size: Large)   Pulse (!) 50   Ht 5\' 11"  (1.803 m)   Wt 215 lb (97.5 kg)   BMI 29.99 kg/m    Constitutional:  Alert and oriented, No acute distress. Cardiovascular: No clubbing, cyanosis, or edema. Respiratory: Normal respiratory effort, no increased work of breathing. GI: Abdomen is soft, nontender, nondistended, no abdominal masses GU: Phallus with patent meatus, testicles 20 cc and descended bilaterally without masses, vas deferens palpable bilaterally   Assessment & Plan:   36 year old male interested in vasectomy for permanent sterilization  We discussed the risks and benefits of vasectomy at length.  Vasectomy is intended to be a permanent form of contraception, and does not produce immediate sterility.  Following vasectomy another form of contraception is required until vas occlusion is confirmed by a post-vasectomy semen analysis obtained 2-3 months after the procedure.  Even  after vas occlusion is confirmed, vasectomy is not 100% reliable in preventing pregnancy, and the failure rate is approximately 04/1998.  Repeat vasectomy is required in less than 1% of patients.  He should refrain from ejaculation for 1 week after vasectomy.  Options for fertility after vasectomy include vasectomy reversal, and sperm retrieval with in vitro fertilization or ICSI.  These options are not always successful and may be expensive.  Finally, there are other permanent and non-permanent alternatives to vasectomy available. There is no risk of erectile dysfunction, and the volume of semen will be similar to prior, as the majority of the ejaculate is from the prostate and seminal vesicles.   The procedure takes ~20 minutes.  We recommend patients take 5-10 mg of Valium 30 minutes prior, and he will need a driver post-procedure.  Local anesthetic is injected into the scrotal skin and a small segment of the vas deferens is removed, and the ends occluded. The complication rate is approximately 1-2%, and includes bleeding, infection, and development of chronic scrotal pain.  PLAN: Schedule vasectomy Valium sent to pharmacy  05/1998, MD 11/18/2021  [facility] [address], [address] [place], [place] [phone]

## 2021-11-23 ENCOUNTER — Ambulatory Visit: Payer: Commercial Managed Care - PPO | Admitting: Urology

## 2021-12-23 ENCOUNTER — Encounter: Payer: Self-pay | Admitting: Urology

## 2021-12-23 ENCOUNTER — Ambulatory Visit (INDEPENDENT_AMBULATORY_CARE_PROVIDER_SITE_OTHER): Payer: Commercial Managed Care - PPO | Admitting: Urology

## 2021-12-23 VITALS — BP 148/89 | HR 58 | Ht 71.0 in | Wt 217.8 lb

## 2021-12-23 DIAGNOSIS — Z302 Encounter for sterilization: Secondary | ICD-10-CM | POA: Diagnosis not present

## 2021-12-23 DIAGNOSIS — Z9852 Vasectomy status: Secondary | ICD-10-CM

## 2021-12-23 NOTE — Patient Instructions (Signed)

## 2021-12-23 NOTE — Progress Notes (Signed)
VASECTOMY PROCEDURE NOTE:  The patient was taken to the minor procedure room and placed in the supine position. His genitals were prepped and draped in the usual sterile fashion. The right vas deferens was brought up to the skin of the right upper scrotum. The skin overlying it was anesthetized with 1% lidocaine without epinephrine, anesthetic was also injected alongside the vas deferens in the direction of the inguinal canal. The no scalpel vasectomy instrument was used to make a small perforation in the scrotal skin. The vasectomy clamp was used to grasp the vas deferens. It was carefully dissected free from surrounding structures. A 1cm segment of the vas was removed, and the cut ends of the mucosa were cauterized. No significant bleeding was noted. The vas deferens was returned to the scrotum. The skin incision was closed with a simple interrupted stitch of 4-0 chromic.  Attention was then turned to the left side. The left vasectomy was performed in the same exact fashion. Sterile dressings were placed over each incision. The patient tolerated the procedure well.  IMPRESSION/DIAGNOSIS: The patient is a 36 year old gentleman who underwent a vasectomy today. Post-procedure instructions were reviewed. I stressed the importance of continuing to use birth control until he provides a semen specimen more than 2 months from now that demonstrates azoospermia.  We discussed return precautions including fever over 101, significant bleeding or hematoma, or uncontrolled pain. I also stressed the importance of avoiding strenuous activity for one week, no sexual activity or ejaculations for 5 days, intermittent icing over the next 48 hours, and scrotal support.   PLAN: The patient will be advised of his semen analysis results when available.  [name], MD 12/23/2021

## 2021-12-30 ENCOUNTER — Encounter: Payer: Self-pay | Admitting: Physician Assistant

## 2021-12-30 ENCOUNTER — Ambulatory Visit: Payer: Commercial Managed Care - PPO | Admitting: Physician Assistant

## 2021-12-30 VITALS — BP 132/84 | HR 74 | Ht 71.0 in | Wt 221.0 lb

## 2021-12-30 DIAGNOSIS — N50819 Testicular pain, unspecified: Secondary | ICD-10-CM

## 2021-12-30 DIAGNOSIS — G8918 Other acute postprocedural pain: Secondary | ICD-10-CM | POA: Diagnosis not present

## 2021-12-30 MED ORDER — MELOXICAM 7.5 MG PO TABS: 7.5000 mg | ORAL_TABLET | Freq: Every day | ORAL | 0 refills | Status: AC

## 2021-12-30 NOTE — Progress Notes (Signed)
12/30/2021 10:14 AM   Baxter Hire [facility] [birth year] [id number]  CC: Chief Complaint  Patient presents with   Sterilization   HPI: Calvin Mcbride is a 36 y.o. male who underwent vasectomy with Dr. Richardo Hanks 7 days ago who presents today for evaluation of L>R testicular pain.    Today he reports he was initially wearing snug briefs for scrotal support and feeling okay, but he returned to work 2 days ago and has noticed increased swelling and bilateral testicular pain radiating to the lower abdomen as the day progresses.  He denies focal tenderness.  He has stopped wearing briefs because they are rubbing up against his sutures, which is uncomfortable.  He has not yet had sex.  No history of GI bleeds or stomach surgery.  He has been taking ibuprofen intermittently.  He denies dysuria and gross hematuria.  PMH: Past Medical History:  Diagnosis Date   Chicken pox    Kidney stones    OSA (obstructive sleep apnea)    per sleep study 8 or 12/2016 Sleep Med     Surgical History: Past Surgical History:  Procedure Laterality Date   Meningioma Left 2020   brain   TONSILLECTOMY AND ADENOIDECTOMY      Home Medications:  Allergies as of 12/30/2021   No Known Allergies      Medication List        Accurate as of December 30, 2021 10:14 AM. If you have any questions, ask your nurse or doctor.          levETIRAcetam 500 MG tablet Commonly known as: KEPPRA Take 1 tablet (500 mg total) by mouth 2 (two) times daily.   meloxicam 7.5 MG tablet Commonly known as: MOBIC Take 1 tablet (7.5 mg total) by mouth daily for 14 days. Started by: Carman Ching, PA-C        Allergies:  No Known Allergies  Family History: Family History  Problem Relation Age of Onset   Heart disease Maternal Grandfather    Colon cancer Neg Hx    Prostate cancer Neg Hx     Social History:   reports that he has never smoked. He has never been exposed to tobacco smoke. He has never  used smokeless tobacco. He reports current alcohol use. He reports that he does not use drugs.  Physical Exam: BP 132/84   Pulse 74   Ht 5\' 11"  (1.803 m)   Wt 221 lb (100.2 kg)   BMI 30.82 kg/m   Constitutional:  Alert and oriented, no acute distress, nontoxic appearing HEENT: [place], AT Cardiovascular: No clubbing, cyanosis, or edema Respiratory: Normal respiratory effort, no increased work of breathing GU: Anticipated mild bruising s/p vasectomy.  Bilateral vas sites are healing well with scant to no drainage.  No erythema, purulence, fluctuance, or crepitus throughout the scrotum.  No focal tenderness.  Nontender epididymides. Skin: No rashes, bruises or suspicious lesions Neurologic: Grossly intact, no focal deficits, moving all 4 extremities Psychiatric: Normal mood and affect  Assessment & Plan:   1. Evaluation of postoperative testicular pain within 30 days of vasectomy No evidence of infection or fluid collection on exam today.  We will start him on 2 weeks of Mobic and counseled him on scrotal support including wearing tight, supportive underwear, elevating the scrotum at rest, and cryotherapy. - meloxicam (MOBIC) 7.5 MG tablet; Take 1 tablet (7.5 mg total) by mouth daily for 14 days.  Dispense: 14 tablet; Refill: 0  Return if symptoms worsen or fail to  improve.  Debroah Loop, PA-C  [facility] [address] [place], [place] [address] [phone]

## 2021-12-30 NOTE — Patient Instructions (Signed)
Scrotal support instructions:  Wear compressive underwear, e.g. jockstrap or snug boxer briefs, to keep the scrotum supported and promote drainage of swelling. Elevate the scrotum when at rest. Tuck a rolled washcloth or towel underneath the scrotum to lift it up and promote drainage back into your abdomen. Use ice as needed for pain relief. Never apply ice to the scrotum for longer than 20 minutes at a time and always keep a layer of fabric between the ice and the skin.

## 2022-03-15 ENCOUNTER — Emergency Department (HOSPITAL_COMMUNITY)
Admission: EM | Admit: 2022-03-15 | Discharge: 2022-03-15 | Disposition: A | Discharge status: Home / Self Care | Payer: Commercial Managed Care - PPO | Attending: Emergency Medicine | Admitting: Emergency Medicine

## 2022-03-15 ENCOUNTER — Emergency Department (HOSPITAL_COMMUNITY): Payer: Commercial Managed Care - PPO

## 2022-03-15 ENCOUNTER — Encounter (HOSPITAL_COMMUNITY): Payer: Self-pay

## 2022-03-15 DIAGNOSIS — Z86011 Personal history of benign neoplasm of the brain: Secondary | ICD-10-CM | POA: Diagnosis not present

## 2022-03-15 DIAGNOSIS — R569 Unspecified convulsions: Secondary | ICD-10-CM | POA: Insufficient documentation

## 2022-03-15 DIAGNOSIS — R404 Transient alteration of awareness: Secondary | ICD-10-CM

## 2022-03-15 HISTORY — DX: Unspecified convulsions: R56.9

## 2022-03-15 LAB — BASIC METABOLIC PANEL
Anion gap: 14 (ref 5–15)
BUN: 17 mg/dL (ref 6–20)
CO2: 23 mmol/L (ref 22–32)
Calcium: 9.4 mg/dL (ref 8.9–10.3)
Chloride: 101 mmol/L (ref 98–111)
Creatinine, Ser: 1.11 mg/dL (ref 0.61–1.24)
GFR, Estimated: >60 mL/min (ref >60)
Glucose, Bld: 106 mg/dL — ABNORMAL HIGH (ref 70–99)
Potassium: 3.6 mmol/L (ref 3.5–5.1)
Sodium: 138 mmol/L (ref 135–145)

## 2022-03-15 LAB — CBC
HCT: 45.2 % (ref 39.0–52.0)
Hemoglobin: 15.6 g/dL (ref 13.0–17.0)
MCH: 29.7 pg (ref 26.0–34.0)
MCHC: 34.5 g/dL (ref 30.0–36.0)
MCV: 86.1 fL (ref 80.0–100.0)
Platelets: 248 10*3/uL (ref 150–400)
RBC: 5.25 MIL/uL (ref 4.22–5.81)
RDW: 13.2 % (ref 11.5–15.5)
WBC: 8.1 10*3/uL (ref 4.0–10.5)
nRBC: 0 % (ref 0.0–0.2)

## 2022-03-15 MED ORDER — LEVETIRACETAM IN NACL 1000 MG/100ML IV SOLN
1000.0000 mg | Freq: Once | INTRAVENOUS | Status: AC
Start: 2022-03-15 — End: 2022-03-15
  Administered 2022-03-15: 1000 mg via INTRAVENOUS

## 2022-03-15 MED ORDER — LEVETIRACETAM 750 MG PO TABS: 750.0000 mg | ORAL_TABLET | Freq: Two times a day (BID) | ORAL | 0 refills | Status: DC

## 2022-03-15 MED ORDER — LEVETIRACETAM IN NACL 1000 MG/100ML IV SOLN
INTRAVENOUS | Status: AC
  Filled 2022-03-15: qty 100

## 2022-03-15 NOTE — Discharge Instructions (Addendum)
No driving for 6 months.  Call neurology for follow-up. For now you can take a pill and a half of the 500 mg tablets.  Twice a day.

## 2022-03-15 NOTE — ED Triage Notes (Signed)
BIBA from home. Witnessed granmal seizure lasting about 2 minutes, was lowered to ground and denies hitting head. Has prior h/o seizures, takes keppra as prescribed and hasn't had seizure in over 1 year. EMS states patient was post ictal for 10 mins en route.      EMS VS: 132/92 100 HR RR 18 96% RA CBG= 72

## 2022-03-15 NOTE — ED Provider Notes (Signed)
[facility] EMERGENCY DEPARTMENT Provider Note   CSN: [id number] Arrival date & time: 03/15/22  2135     History  Chief Complaint  Patient presents with   Seizures    Calvin Mcbride is a 35 y.o. male.   Seizures Patient presents after tonic-clonic seizure at home.  History of seizures since he has had meningioma surgery.  He is on Keppra 500 twice a day.  States has been taking overall mostly as prescribed.  May have missed a dose this last week.  Wife witnessed seizure.  No injury from the fall but did bite his tongue.  No loss of bladder control.  Was confused after but now back at baseline.  Has not been sleeping much recently due to having a 72-year-old.    Past Medical History:  Diagnosis Date   Chicken pox    Kidney stones    OSA (obstructive sleep apnea)    per sleep study 8 or 12/2016 Sleep Med    Seizures (HCC)    Past Surgical History:  Procedure Laterality Date   Meningioma Left 2020   brain   TONSILLECTOMY AND ADENOIDECTOMY     VASECTOMY       Home Medications Prior to Admission medications   Medication Sig Start Date End Date Taking? Authorizing Provider  levETIRAcetam (KEPPRA) 750 MG tablet Take 1 tablet (750 mg total) by mouth 2 (two) times daily. 03/15/22   Benjiman Core, MD      Allergies    Morphine    Review of Systems   Review of Systems  Neurological:  Positive for seizures.    Physical Exam Updated Vital Signs BP 134/78   Pulse 92   Temp 98.4 F (36.9 C) (Oral)   Resp (!) 21   Ht 5\' 11"  (1.803 m)   Wt 97.5 kg   SpO2 99%   BMI 29.99 kg/m  Physical Exam Vitals and nursing note reviewed.  Constitutional:      Appearance: Normal appearance.  HENT:     Mouth/Throat:     Comments: Patient bit tongue Chest:     Chest wall: No tenderness.  Abdominal:     Tenderness: There is no abdominal tenderness.  Musculoskeletal:        General: No tenderness.     Cervical back: Neck supple.  Skin:     Capillary Refill: Capillary refill takes less than 2 seconds.  Neurological:     Mental Status: He is alert. Mental status is at baseline.     ED Results / Procedures / Treatments   Labs (all labs ordered are listed, but only abnormal results are displayed) Labs Reviewed  BASIC METABOLIC PANEL - Abnormal; Notable for the following components:      Result Value   Glucose, Bld 106 (*)    All other components within normal limits  CBC  LEVETIRACETAM LEVEL    EKG None  Radiology CT HEAD WO CONTRAST ( )  Result Date: 03/15/2022 CLINICAL DATA:  Seizure disorder, clinical change EXAM: CT HEAD WITHOUT CONTRAST TECHNIQUE: Contiguous axial images were obtained from the base of the skull through the vertex without intravenous contrast. RADIATION DOSE REDUCTION: This exam was performed according to the departmental dose-optimization program which includes automated exposure control, adjustment of the mA and/or kV according to patient size and/or use of iterative reconstruction technique. COMPARISON:  04/29/2020 FINDINGS: Brain: Bifrontal encephalomalacia underlying craniotomy defect. Findings stable since prior study. No acute infarction, hemorrhage or hydrocephalus. Vascular: No hyperdense vessel or  unexpected calcification. Skull: Prior frontal craniotomy.  No acute calvarial abnormality. Sinuses/Orbits: No acute findings Other: None IMPRESSION: Bifrontal encephalomalacia, stable. No acute intracranial abnormality. Electronically Signed   By: Rolm Baptise M.D.   On: 03/15/2022 23:12    Procedures Procedures    Medications Ordered in ED Medications  levETIRAcetam (KEPPRA) IVPB 1000 mg/100 mL premix (0 mg Intravenous Stopped 03/15/22 2238)    ED Course/ Medical Decision Making/ A&P                           Medical Decision Making Amount and/or Complexity of Data Reviewed Labs: ordered. Radiology: ordered.  Risk Prescription drug management.   Patient with seizure.  History of  same.  Previous meningioma.  No trauma from fall but did bite tongue.  Had not been sleeping well and may have missed a dose of medicines.  Is only on 500 mg.  Differential diagnosis includes breakthrough seizure less likely return to meningioma.  Scheduled the next month or 2 to have repeat MRI.  Will get CT scan today.  We will get basic blood work and check Keppra level.  However Keppra level not come back.  We will load on the 1000 mg and plan on increasing the dose.  If remains seizure-free and lab and CT reassuring hopefully should be able to discharge home with outpatient follow-up.  CT scan reviewed and independently interpreted and shows just chronic changes.  Lab work reassuring.  Will discharge home with neurology follow-up        Final Clinical Impression(s) / ED Diagnoses Final diagnoses:  Seizure [facility])    Rx / DC Orders ED Discharge Orders          Ordered    levETIRAcetam (KEPPRA) 750 MG tablet  2 times daily        03/15/22 2328              Davonna Belling, MD 03/15/22 2345

## 2022-03-15 NOTE — ED Triage Notes (Signed)
Patient bit tongue. Only c/o tongue pain.

## 2022-03-16 ENCOUNTER — Telehealth: Payer: Self-pay

## 2022-03-16 NOTE — Telephone Encounter (Signed)
Transition Care Management Unsuccessful Follow-up Telephone Call  Date of discharge and from where:  [facility] 03/15/2022  Attempts:  1st Attempt  Reason for unsuccessful TCM follow-up call:  Left voice message Karena Addison, LPN [facility] Nurse Health Advisor Direct Dial [phone]

## 2022-03-17 LAB — LEVETIRACETAM LEVEL: Levetiracetam Lvl: <2 ug/mL — ABNORMAL LOW (ref 10.0–40.0)

## 2022-03-18 ENCOUNTER — Encounter: Payer: Self-pay | Admitting: Neurology

## 2022-03-21 NOTE — Telephone Encounter (Signed)
Transition Care Management Unsuccessful Follow-up Telephone Call  Date of discharge and from where:  [facility] 03/15/2022  Attempts:  2nd Attempt  Reason for unsuccessful TCM follow-up call:  Left voice message Karena Addison, LPN [facility] Nurse Health Advisor Direct Dial [phone]

## 2022-03-23 ENCOUNTER — Encounter: Payer: Self-pay | Admitting: Neurology

## 2022-03-23 ENCOUNTER — Ambulatory Visit: Payer: Commercial Managed Care - PPO | Admitting: Neurology

## 2022-03-23 VITALS — BP 137/83 | HR 58 | Ht 71.0 in | Wt 228.0 lb

## 2022-03-23 DIAGNOSIS — G40409 Other generalized epilepsy and epileptic syndromes, not intractable, without status epilepticus: Secondary | ICD-10-CM | POA: Diagnosis not present

## 2022-03-23 DIAGNOSIS — Z8603 Personal history of neoplasm of uncertain behavior: Secondary | ICD-10-CM

## 2022-03-23 DIAGNOSIS — G4733 Obstructive sleep apnea (adult) (pediatric): Secondary | ICD-10-CM | POA: Diagnosis not present

## 2022-03-23 DIAGNOSIS — Z9889 Other specified postprocedural states: Secondary | ICD-10-CM

## 2022-03-23 MED ORDER — LEVETIRACETAM ER 750 MG PO TB24: 1500.0000 mg | ORAL_TABLET | Freq: Every day | ORAL | 11 refills | Status: DC

## 2022-03-23 MED ORDER — LEVETIRACETAM ER 750 MG PO TB24: 750.0000 mg | ORAL_TABLET | Freq: Every day | ORAL | 11 refills | Status: DC

## 2022-03-23 NOTE — Addendum Note (Signed)
Addended by: Arther Abbott on: 03/23/2022 03:06 PM   Modules accepted: Orders

## 2022-03-23 NOTE — Telephone Encounter (Signed)
Pt called stating that the instructions on his Keppra Rx is wrong and states that the provider informed him that he is to take 1500mg  once a day not 750mg  XR Pt would like to be informed when the updated Rx has been sent in so that he can go pick it up.

## 2022-03-23 NOTE — Progress Notes (Signed)
[facility]  PATIENT: Calvin Mcbride DOB: 1985/10/14  REFERRING DOCTOR OR PCP: Mable Paris, FNP SOURCE: Patient, wife, notes from the emergency room, laboratory results, imaging reports, CT scan images personally reviewed.  _________________________________   HISTORICAL  CHIEF COMPLAINT:  Chief Complaint  Patient presents with   Follow-up    RM 2 with Janett Billow, spouse. Seizure f/u. Had seizure 03/15/22.  Wife heard noise. Saw husband have msucle spasms in arms while standing up. He went to fall and she caught him and brought him to the floor.  He bite tongue/was bleeding. Wife feels he stopped breathing. He was out of it after episode. Pushed her away, grabbed her face. Confused. Not able to correctly answer questoins. Had to have oxgygen from EMS. Does not remember this. Blood sugar low.   Other     Episode lasted less than 5 min (around 45min). Went to hospital. They did CT scan and blood work that looked ok. Increased Keppra from 500mg  BID to 750mg  BID. Received IV keppra in the hospital. He has had no further seizure episodes since. Doing ok.    HISTORY OF PRESENT ILLNESS:  Calvin Mcbride is a 36 yo man with a possible seizure and h/o large meningioma resection with subsequent frontal encephalomalacia.  Marland Kitchen  Update 11/26/2 He felt he was having a  normal evening at home.   His wife heard a noise from the kitchen and quickly saw him.  He had arms clenched, looked tense ad then he fell as convulsions sarted.  His wife caught him and lowered him to the ground.     He bit his tongue with blood and foam.   He was holding his breath and then as the seizure stopped started breathing.   He was very confused and groggy and EMT was called.   He gradually improved over the next 10-15 minutes.  He was abl to walk to the ambulance and felt close to baseline before bein taken to the ED.    CT showed encephalomalacia from left meningioma/surgery.     He was on Keppra  500  mg bid but was sometimes noncompliant,   He may have been sleeping poorly around that time with just 4 hours of sleep due to daughter's sleeping issues (age 8).   He also had not been feeling good and may have had a viral infection a few days earlier.   Seizure History: He had an episode of altered consciousness 04/29/2020.   He was in his shop near his home.  He recalls walking out of the shop and next remembering being in his living room.   Looking at timing of texts between him and his wife, he lost about 12 minutes.    There were no witnesses to what occurred outside of the shop or the house..  He believes he fell after he left the shop as he did have grass stains and had a facial abrasion consistent with falling to the gravel.  However, it is not known if he lost consciousness and then fell or if he fell and then had the altered awareness.  His wife heard him come into the house and saw him a couple minutes later.  She noted the facial abrasions.   She started asking him questions but he did not know what had happened.  He does not recall these questions.   About 15 minutes later he began to be more clear and he has memory from that point on.  He  went to the ED and had labs, EKG and CT scan.    Lab tests were essentially normal.  The CT scan showed sequela of a previous large frontal meningioma resection.  There was no definite acute findings.  He was placed on Keppra 500 mg po bid.     EEG 05/13/2020 showed "This EEG while the patient was awake and drowsy showed intermittent frontal slowing consistent with mild underlying cerebral dysfunction.  No sharp waves or spikes were noted."  He had a second seizure witnessed almost in entirety by his wife with several seconds of tonic arm clenching followed by 30 to 45 seconds of clonic activity.  He had tongue biting.  He was groggy for 10 to 15 minutes afterwards.  He went to the emergency room and head CT showed chronic changes associated with his meningioma  and surgery but no acute findings.  Meningioma history:  He had some visual changes and was noted to have papilledema in January 2020.   MRI of the brain showed a large left paramedian anterior frontal falcine meningioma-measuring 5.2 cm in longest diameter..   He had a resection at [facility] and recovererd well.  He has had a couple of imaging studies since.  I do not have the actual images but the report does state bilateral encephalomalacia/gliosis.  There has been unexpected resorption of the bone flap that is being followed.  Images: MRI of the brain 05/10/2018 shows a large left frontal falx seen meningioma measuring 5.2 cm in longest diameter.  There is adjacent left frontal mass-effect and left to right shift.  There was 11 mm of shift and edema spreading far into the left frontal lobe  CT scan from 04/29/2020 CT shows resection of the meningioma.  There is encephalomalacia bilaterally, left much greater than right.  He has evidence of a bifrontal craniotomy.  There is resumption of some of the bone flap.   CT scan 03/15/2022 shows the meningioma resection with left greater than right encephalomalacia.  OSA Sleep study 06/01/2020 showed mild OSA that was moderate during REM sleep (AHI equals 20).  He used a self-molded oral appliance with benefit in the past his wife noted that his OSA and snoring resolved.  However, the appliance was uncomfortable.  He spoke to his dentist about getting a custom oral appliance made.  Unfortunately his insurance denied the request.   He had had a CPAP trial in 01/16/2017 but did not find CPAP comfortable.   He feels his compliance would be better with an oral appliance than a CPAP.     He lost about 30-35 pounds last year but gained 10 pounds back.    His snoring improved and he has not tried another oral appliance.   He sleeps well and feels rested when he wakes up.  He does not fall asleep if unplanned and has no excessive daytime sleepiness.   REVIEW OF  SYSTEMS: Constitutional: No fevers, chills, sweats, or change in appetite Eyes: No visual changes, double vision, eye pain Ear, nose and throat: No hearing loss, ear pain, nasal congestion, sore throat Cardiovascular: No chest pain, palpitations Respiratory:  No shortness of breath at rest or with exertion.   No wheezes GastrointestinaI: No nausea, vomiting, diarrhea, abdominal pain, fecal incontinence Genitourinary:  No dysuria, urinary retention or frequency.  No nocturia. Musculoskeletal:  No neck pain, back pain Integumentary: No rash, pruritus, skin lesions Neurological: as above Psychiatric: No depression at this time.  No anxiety Endocrine: No palpitations, diaphoresis, change  in appetite, change in weigh or increased thirst Hematologic/Lymphatic:  No anemia, purpura, petechiae. Allergic/Immunologic: No itchy/runny eyes, nasal congestion, recent allergic reactions, rashes  ALLERGIES: Allergies  Allergen Reactions   Morphine Other (See Comments)    Patient states doesn't react well, he "knocked out"    HOME MEDICATIONS:  Current Outpatient Medications:    levETIRAcetam (KEPPRA XR) 750 MG 24 hr tablet, Take 1 tablet (750 mg total) by mouth daily., Disp: 60 tablet, Rfl: 11  PAST MEDICAL HISTORY: Past Medical History:  Diagnosis Date   Chicken pox    Kidney stones    OSA (obstructive sleep apnea)    per sleep study 8 or 12/2016 Sleep Med    Seizures (HCC)     PAST SURGICAL HISTORY: Past Surgical History:  Procedure Laterality Date   Meningioma Left 2020   brain   TONSILLECTOMY AND ADENOIDECTOMY     VASECTOMY      FAMILY HISTORY: Family History  Problem Relation Age of Onset   Heart disease Maternal Grandfather    Colon cancer Neg Hx    Prostate cancer Neg Hx     SOCIAL HISTORY:  Social History   Socioeconomic History   Marital status: Married    Spouse name: Shanda Bumps   Number of children: Not on file   Years of education: Not on file   Highest  education level: Not on file  Occupational History   Occupation: Full time  Tobacco Use   Smoking status: Never    Passive exposure: Never   Smokeless tobacco: Never  Vaping Use   Vaping Use: Never used  Substance and Sexual Activity   Alcohol use: Yes    Comment: Occasional   Drug use: No   Sexual activity: Yes  Other Topics Concern   Not on file  Social History Narrative   Lives with wife and 2 kids   Right Handed   Drinks 4-5 cups daily caffeine   Social Determinants of Health   Financial Resource Strain: Not on file  Food Insecurity: Not on file  Transportation Needs: Not on file  Physical Activity: Not on file  Stress: Not on file  Social Connections: Not on file  Intimate Partner Violence: Not on file     PHYSICAL EXAM  Vitals:   03/23/22 0812  BP: 137/83  Pulse: (!) 58  Weight: 228 lb (103.4 kg)  Height: 5\' 11"  (1.803 m)    Body mass index is 31.8 kg/m.   General: The patient is well-developed and well-nourished and in no acute distress  HEENT: He has evidence of the bifrontal craniotomy.  Sclera are anicteric.    Skin: Extremities are without rash or  edema.    Neurologic Exam  Mental status: The patient is alert and oriented x 3 at the time of the examination. The patient has apparent normal recent and remote memory, with an apparently normal attention span and concentration ability.   Speech is normal.  Cranial nerves: Extraocular movements are full. Pupils are equal, round, and reactive to light and accomodation.  Facial strength was normal.  No obvious hearing deficits are noted.  Motor:  Muscle bulk is normal.   Tone is normal. Strength is  5 / 5 in all 4 extremities.   Coordination: Cerebellar testing reveals good finger-nose-finger and heel-to-shin bilaterally.  Gait and station: Station is normal.   Gait is normal. Tandem gait is normal. Romberg is negative.   Reflexes: Deep tendon reflexes are symmetric and normal in arms, increased  at the knees with spread.  No ankle clonus.Marland Kitchen      DIAGNOSTIC DATA (LABS, IMAGING, TESTING) - I reviewed patient records, labs, notes, testing and imaging myself where available.  Lab Results  Component Value Date   WBC 8.1 03/15/2022   HGB 15.6 03/15/2022   HCT 45.2 03/15/2022   MCV 86.1 03/15/2022   PLT 248 03/15/2022      Component Value Date/Time   NA 138 03/15/2022 2145   K 3.6 03/15/2022 2145   CL 101 03/15/2022 2145   CO2 23 03/15/2022 2145   GLUCOSE 106 (H) 03/15/2022 2145   BUN 17 03/15/2022 2145   CREATININE 1.11 03/15/2022 2145   CALCIUM 9.4 03/15/2022 2145   PROT 7.7 03/18/2020 1036   ALBUMIN 4.9 03/18/2020 1036   AST 30 03/18/2020 1036   ALT 51 03/18/2020 1036   ALKPHOS 74 03/18/2020 1036   BILITOT 0.7 03/18/2020 1036   GFRNONAA >60 03/15/2022 2145   Lab Results  Component Value Date   CHOL 228 (H) 03/18/2020   HDL 43.40 03/18/2020   LDLCALC 122 (H) 11/07/2016   LDLDIRECT 166.0 03/18/2020   TRIG 252.0 (H) 03/18/2020   CHOLHDL 5 03/18/2020   Lab Results  Component Value Date   HGBA1C 5.5 03/18/2020   No results found for: "VITAMINB12" Lab Results  Component Value Date   TSH 2.96 03/18/2020       ASSESSMENT AND PLAN  Generalized tonic-clonic seizure ([place])  History of resection of meningioma  OSA (obstructive sleep apnea)  1.  He had a generalized tonic-clonic seizure that likely occurred in the setting of reduced sleep and reduced compliance of Keppra dosing.  This was a second seizure.  I will change him to once a day extended release version of Keppra at a dose of 1500 mg.  Hopefully this will improve compliance.  We discussed the [place] driving laws.  I showed him and his wife the presurgical MRI with a large 50 mm tumor and extensive mass effect and shift.  Although the CT scans show some encephalomalacia.  Edema and shift has resolved. 2.   Advised to try to get at least 6-1/2 hours of sleep nightly.  He has OSA.  He has  tried to  lose weight.  He prefers not to use CPAP and he found an oral appliance uncomfortable. 3.   Return in 1 year or sooner if there are new or worsening neurologic symptoms   40-minute office visit with the majority of the time spent face-to-face for history and physical, discussion/counseling and decision-making.  Additional time with record review, imaging review and documentation.  Naveen Clardy A. Felecia Shelling, MD, [facility] [id number], [id number] AM Certified in Neurology, Clinical Neurophysiology, Sleep Medicine and Neuroimaging  [facility] [address] [place], [place] [address] [phone]

## 2022-03-24 ENCOUNTER — Other Ambulatory Visit: Payer: Commercial Managed Care - PPO

## 2022-03-24 NOTE — Telephone Encounter (Signed)
Transition Care Management Unsuccessful Follow-up Telephone Call  Date of discharge and from where:  [facility] 03/15/2022  Attempts:  3rd Attempt  Reason for unsuccessful TCM follow-up call:  Left voice message Karena Addison, LPN [facility] Nurse Health Advisor Direct Dial [phone]

## 2022-03-25 ENCOUNTER — Other Ambulatory Visit: Payer: Commercial Managed Care - PPO

## 2022-04-13 ENCOUNTER — Encounter: Payer: Self-pay | Admitting: Neurology

## 2022-04-19 DIAGNOSIS — Z0289 Encounter for other administrative examinations: Secondary | ICD-10-CM

## 2022-04-19 NOTE — Telephone Encounter (Signed)
Gave completed/signed form back to medical records to process for pt. 

## 2022-04-22 ENCOUNTER — Other Ambulatory Visit: Payer: Commercial Managed Care - PPO

## 2022-04-22 DIAGNOSIS — Z9852 Vasectomy status: Secondary | ICD-10-CM

## 2022-04-24 LAB — POST-VAS SPERM EVALUATION,QUAL: Volume: 4 mL

## 2022-06-23 ENCOUNTER — Other Ambulatory Visit: Payer: Self-pay | Admitting: *Deleted

## 2022-06-23 ENCOUNTER — Encounter: Payer: Self-pay | Admitting: Neurology

## 2022-06-23 MED ORDER — LEVETIRACETAM ER 750 MG PO TB24: 1500.0000 mg | ORAL_TABLET | Freq: Every day | ORAL | 1 refills | Status: DC

## 2023-03-15 ENCOUNTER — Encounter: Payer: Self-pay | Admitting: Neurology

## 2023-03-15 ENCOUNTER — Ambulatory Visit: Payer: Commercial Managed Care - PPO | Admitting: Neurology

## 2023-03-15 VITALS — BP 144/90 | HR 62 | Ht 71.0 in | Wt 225.0 lb

## 2023-03-15 DIAGNOSIS — Z9889 Other specified postprocedural states: Secondary | ICD-10-CM

## 2023-03-15 DIAGNOSIS — Z8603 Personal history of neoplasm of uncertain behavior: Secondary | ICD-10-CM

## 2023-03-15 DIAGNOSIS — G40409 Other generalized epilepsy and epileptic syndromes, not intractable, without status epilepticus: Secondary | ICD-10-CM | POA: Diagnosis not present

## 2023-03-15 DIAGNOSIS — G4733 Obstructive sleep apnea (adult) (pediatric): Secondary | ICD-10-CM

## 2023-03-15 MED ORDER — LEVETIRACETAM ER 750 MG PO TB24: 1500.0000 mg | ORAL_TABLET | Freq: Every day | ORAL | 4 refills | Status: DC

## 2023-03-15 NOTE — Progress Notes (Signed)
[facility]  PATIENT: Calvin Mcbride DOB: [birth year]  REFERRING DOCTOR OR PCP: Rennie Plowman, FNP SOURCE: Patient, wife, notes from the emergency room, laboratory results, imaging reports, CT scan images personally reviewed.  _________________________________   HISTORICAL  CHIEF COMPLAINT:  Chief Complaint  Patient presents with   Room 11    Pt is here Alone. Pt states that things have been going well since last appointment. Pt states that he would like to discuss possibly coming off seizure medication.     HISTORY OF PRESENT ILLNESS:  Calvin Mcbride is a 37 yo man with a possible seizure and h/o large meningioma resection with subsequent frontal encephalomalacia.  Marland Kitchen  Update 03/15/2023 He is currently on levetiracetam XR 1500 mg a day.  He tolerates the medication well.  Recent compliance is good but he prefers not to take any medication.  He asked about the possibility of coming off of long-term epilepsy treatment.   Seizure History: He had an episode of altered consciousness 04/29/2020.   He was in his shop near his home.  He recalls walking out of the shop and next remembering being in his living room.   Looking at timing of texts between him and his wife, he lost about 12 minutes.    There were no witnesses to what occurred outside of the shop or the house..  He believes he fell after he left the shop as he did have grass stains and had a facial abrasion consistent with falling to the gravel.  However, it is not known if he lost consciousness and then fell or if he fell and then had the altered awareness.  His wife heard him come into the house and saw him a couple minutes later.  She noted the facial abrasions.   She started asking him questions but he did not know what had happened.  He does not recall these questions.   About 15 minutes later he began to be more clear and he has memory from that point on.  He went to the ED and had labs, EKG and CT scan.     Lab tests were essentially normal.  The CT scan showed sequela of a previous large frontal meningioma resection.  There was no definite acute findings.  He was placed on Keppra 500 mg po bid.   In November 2023, he had a second seizure witnessed almost in entirety by his wife with several seconds of tonic arm clenching followed by 30 to 45 seconds of clonic activity.  He had tongue biting.  He was groggy for 10 to 15 minutes afterwards.  He went to the emergency room and head CT showed chronic changes associated with his meningioma and surgery but no acute findings.  EEG 05/13/2020 showed "This EEG while the patient was awake and drowsy showed intermittent frontal slowing consistent with mild underlying cerebral dysfunction.  No sharp waves or spikes were noted."  Meningioma history:  He had  visual changes and was noted to have papilledema in January 2020.   MRI of the brain showed a large left paramedian anterior frontal falcine meningioma-measuring 5.2 cm in longest diameter..   He had a resection at [facility] and recovererd well.  He has had a couple of imaging studies since.    Images: MRI of the brain 05/10/2018 shows a large left frontal falx seen meningioma measuring 5.2 cm in longest diameter.  There is adjacent left frontal mass-effect and left to right shift.  There was 11  mm of shift and edema spreading far into the left frontal lobe  CT scan from 04/29/2020 CT shows resection of the meningioma.  There is encephalomalacia bilaterally, left much greater than right.  He has evidence of a bifrontal craniotomy.  There is resumption of some of the bone flap.   CT scan 03/15/2022 shows the meningioma resection with left greater than right encephalomalacia.  OSA Sleep study 06/01/2020 showed mild OSA that was moderate during REM sleep (AHI equals 20).  He used a self-molded oral appliance with benefit in the past his wife noted that his OSA and snoring resolved.  However, the appliance was uncomfortable.   He spoke to his dentist about getting a custom oral appliance made.  Unfortunately his insurance denied the request.   He had had a CPAP trial in 01/16/2017 but did not find CPAP comfortable.   He feels his compliance would be better with an oral appliance than a CPAP.     He lost about 30-35 pounds last year but gained 10 pounds back.    His snoring improved and he has not tried another oral appliance.   He sleeps well and feels rested when he wakes up.  He does not fall asleep if unplanned and has no excessive daytime sleepiness.   REVIEW OF SYSTEMS: Constitutional: No fevers, chills, sweats, or change in appetite Eyes: No visual changes, double vision, eye pain Ear, nose and throat: No hearing loss, ear pain, nasal congestion, sore throat Cardiovascular: No chest pain, palpitations Respiratory:  No shortness of breath at rest or with exertion.   No wheezes GastrointestinaI: No nausea, vomiting, diarrhea, abdominal pain, fecal incontinence Genitourinary:  No dysuria, urinary retention or frequency.  No nocturia. Musculoskeletal:  No neck pain, back pain Integumentary: No rash, pruritus, skin lesions Neurological: as above Psychiatric: No depression at this time.  No anxiety Endocrine: No palpitations, diaphoresis, change in appetite, change in weigh or increased thirst Hematologic/Lymphatic:  No anemia, purpura, petechiae. Allergic/Immunologic: No itchy/runny eyes, nasal congestion, recent allergic reactions, rashes  ALLERGIES: Allergies  Allergen Reactions   Morphine Other (See Comments)    Patient states doesn't react well, he "knocked out"    HOME MEDICATIONS:  Current Outpatient Medications:    levETIRAcetam (KEPPRA XR) 750 MG 24 hr tablet, Take 2 tablets (1,500 mg total) by mouth daily., Disp: 180 tablet, Rfl: 4  PAST MEDICAL HISTORY: Past Medical History:  Diagnosis Date   Chicken pox    Kidney stones    OSA (obstructive sleep apnea)    per sleep study 8 or 12/2016 Sleep  Med    Seizures (HCC)     PAST SURGICAL HISTORY: Past Surgical History:  Procedure Laterality Date   Meningioma Left 2020   brain   TONSILLECTOMY AND ADENOIDECTOMY     VASECTOMY      FAMILY HISTORY: Family History  Problem Relation Age of Onset   Heart disease Maternal Grandfather    Colon cancer Neg Hx    Prostate cancer Neg Hx     SOCIAL HISTORY:  Social History   Socioeconomic History   Marital status: Married    Spouse name: Shanda Bumps   Number of children: Not on file   Years of education: Not on file   Highest education level: Not on file  Occupational History   Occupation: Full time  Tobacco Use   Smoking status: Never    Passive exposure: Never   Smokeless tobacco: Never  Vaping Use   Vaping status: Never Used  Substance and Sexual Activity   Alcohol use: Yes    Comment: Occasional   Drug use: No   Sexual activity: Yes  Other Topics Concern   Not on file  Social History Narrative   Lives with wife and 2 kids   Right Handed   Drinks 4-5 cups daily caffeine   Social Determinants of Health   Financial Resource Strain: Not on file  Food Insecurity: Not on file  Transportation Needs: Not on file  Physical Activity: Not on file  Stress: Not on file  Social Connections: Not on file  Intimate Partner Violence: Not on file     PHYSICAL EXAM  Vitals:   03/15/23 1555  BP: (!) 144/90  Pulse: 62  Weight: 225 lb (102.1 kg)  Height: 5\' 11"  (1.803 m)    Body mass index is 31.38 kg/m.   General: The patient is well-developed and well-nourished and in no acute distress  HEENT: He has evidence of the bifrontal craniotomy.  Sclera are anicteric.    Skin: Extremities are without rash or  edema.    Neurologic Exam  Mental status: The patient is alert and oriented x 3 at the time of the examination. The patient has apparent normal recent and remote memory, with an apparently normal attention span and concentration ability.   Speech is  normal.  Cranial nerves: Extraocular movements are full. Pupils are equal, round, and reactive to light and accomodation.  Facial strength was normal.  No obvious hearing deficits are noted.  Motor:  Muscle bulk is normal.   Tone is normal. Strength is  5 / 5 in all 4 extremities.   Coordination: Cerebellar testing reveals good finger-nose-finger and heel-to-shin bilaterally.  Gait and station: Station is normal.   Gait is normal. Tandem gait is normal. Romberg is negative.   Reflexes: Deep tendon reflexes are symmetric and normal in arms, increased at the knees with spread.  No ankle clonus.Marland Kitchen      DIAGNOSTIC DATA (LABS, IMAGING, TESTING) - I reviewed patient records, labs, notes, testing and imaging myself where available.  Lab Results  Component Value Date   WBC 8.1 03/15/2022   HGB 15.6 03/15/2022   HCT 45.2 03/15/2022   MCV 86.1 03/15/2022   PLT 248 03/15/2022      Component Value Date/Time   NA 138 03/15/2022 2145   K 3.6 03/15/2022 2145   CL 101 03/15/2022 2145   CO2 23 03/15/2022 2145   GLUCOSE 106 (H) 03/15/2022 2145   BUN 17 03/15/2022 2145   CREATININE 1.11 03/15/2022 2145   CALCIUM 9.4 03/15/2022 2145   PROT 7.7 03/18/2020 1036   ALBUMIN 4.9 03/18/2020 1036   AST 30 03/18/2020 1036   ALT 51 03/18/2020 1036   ALKPHOS 74 03/18/2020 1036   BILITOT 0.7 03/18/2020 1036   GFRNONAA >60 03/15/2022 2145   Lab Results  Component Value Date   CHOL 228 (H) 03/18/2020   HDL 43.40 03/18/2020   LDLCALC 122 (H) 11/07/2016   LDLDIRECT 166.0 03/18/2020   TRIG 252.0 (H) 03/18/2020   CHOLHDL 5 03/18/2020   Lab Results  Component Value Date   HGBA1C 5.5 03/18/2020   No results found for: "VITAMINB12" Lab Results  Component Value Date   TSH 2.96 03/18/2020       ASSESSMENT AND PLAN  Generalized tonic-clonic seizure (HCC)  History of resection of meningioma  OSA (obstructive sleep apnea)  1.  Since restarting the levetiracetam on the dose is 1500 mg once a  day, he has not had any further seizures.  His second seizure likely occurred in the setting of reduced sleep and reduced compliance of Keppra dosing.  2.   Advised to try to get at least 6-1/2 hours of sleep nightly.  He has OSA.  He has  tried to lose weight.  He prefers not to use CPAP and he found an oral appliance uncomfortable. 3.   Return in 1 year or sooner if there are new or worsening neurologic symptoms     Danniel Tones A. Epimenio Foot, MD, [facility] 03/15/2023, 4:31 PM Certified in Neurology, Clinical Neurophysiology, Sleep Medicine and Neuroimaging  [facility] [address] [place], [place] [address] [phone]

## 2024-03-19 ENCOUNTER — Ambulatory Visit: Payer: Commercial Managed Care - PPO | Admitting: Adult Health

## 2024-03-19 ENCOUNTER — Encounter: Payer: Self-pay | Admitting: Adult Health

## 2024-03-19 VITALS — BP 116/79 | HR 73 | Ht 71.0 in | Wt 222.4 lb

## 2024-03-19 DIAGNOSIS — G40409 Other generalized epilepsy and epileptic syndromes, not intractable, without status epilepticus: Secondary | ICD-10-CM | POA: Diagnosis not present

## 2024-03-19 DIAGNOSIS — Z9889 Other specified postprocedural states: Secondary | ICD-10-CM

## 2024-03-19 DIAGNOSIS — Z8603 Personal history of neoplasm of uncertain behavior: Secondary | ICD-10-CM | POA: Diagnosis not present

## 2024-03-19 MED ORDER — LEVETIRACETAM ER 750 MG PO TB24: 1500.0000 mg | ORAL_TABLET | Freq: Every day | ORAL | 4 refills | Status: AC

## 2024-03-19 NOTE — Progress Notes (Signed)
 [facility]  PATIENT: Calvin Mcbride DOB: [birth year]  REFERRING DOCTOR OR PCP: Rollene Northern, FNP SOURCE: Patient and chart  _________________________________   HISTORICAL  CHIEF COMPLAINT:  Chief Complaint  Patient presents with   RM 8     Patient is here alone for seizure - no seizures. Last seizure was in 2021 or 2022     HISTORY OF PRESENT ILLNESS:  Calvin Mcbride is a 38 yo man with a possible seizure in 04/2020 and 02/2022 and h/o large meningioma resection in 2020 with subsequent frontal encephalomalacia.  SABRA  Update 03/19/2024 JM: Returns for follow-up visit.  Prior visit with Dr. Vear on 03/15/2023.  He is currently on levetiracetam  XR 1500 mg a day.  He tolerates the medication well.  He does question if this medication will be lifelong. At visit with Dr. Vear in 2023, discussed reducing Keppra  dose down to 250mg  BID for 2 weeks in 04/2022 and then plan on repeat EEG. Per patient, he attempted to decrease keppra  dose towards end of 2023 but misunderstood directions and stopped medication too quickly and had recurrent seizure. He feels if he came off medication as advised, this would not have occurred. Per patient, his neurosurgeon did not feel seizure was related to meningioma or surgical procedure as seizure would have occurred shortly after procedure, not 2 years later.      Seizure History: He had an episode of altered consciousness 04/29/2020.   He was in his shop near his home.  He recalls walking out of the shop and next remembering being in his living room.   Looking at timing of texts between him and his wife, he lost about 12 minutes.    There were no witnesses to what occurred outside of the shop or the house..  He believes he fell after he left the shop as he did have grass stains and had a facial abrasion consistent with falling to the gravel.  However, it is not known if he lost consciousness and then fell or if he fell and then had the  altered awareness.  His wife heard him come into the house and saw him a couple minutes later.  She noted the facial abrasions.   She started asking him questions but he did not know what had happened.  He does not recall these questions.   About 15 minutes later he began to be more clear and he has memory from that point on.  He went to the ED and had labs, EKG and CT scan.    Lab tests were essentially normal.  The CT scan showed sequela of a previous large frontal meningioma resection.  There was no definite acute findings.  He was placed on Keppra  500 mg po bid.   In November 2023, he had a second seizure witnessed almost in entirety by his wife with several seconds of tonic arm clenching followed by 30 to 45 seconds of clonic activity.  He had tongue biting.  He was groggy for 10 to 15 minutes afterwards.  He went to the emergency room and head CT showed chronic changes associated with his meningioma and surgery but no acute findings.  EEG 05/13/2020 showed This EEG while the patient was awake and drowsy showed intermittent frontal slowing consistent with mild underlying cerebral dysfunction.  No sharp waves or spikes were noted.  Meningioma history:  He had  visual changes and was noted to have papilledema in January 2020.   MRI of the brain  showed a large left paramedian anterior frontal falcine meningioma-measuring 5.2 cm in longest diameter..   He had a resection at [facility] and recovererd well.  He has had a couple of imaging studies since.    Images: MRI of the brain 05/10/2018 shows a large left frontal falx seen meningioma measuring 5.2 cm in longest diameter.  There is adjacent left frontal mass-effect and left to right shift.  There was 11 mm of shift and edema spreading far into the left frontal lobe  CT scan from 04/29/2020 CT shows resection of the meningioma.  There is encephalomalacia bilaterally, left much greater than right.  He has evidence of a bifrontal craniotomy.  There is resumption of  some of the bone flap.   CT scan 03/15/2022 shows the meningioma resection with left greater than right encephalomalacia.       REVIEW OF SYSTEMS: Constitutional: No fevers, chills, sweats, or change in appetite Eyes: No visual changes, double vision, eye pain Ear, nose and throat: No hearing loss, ear pain, nasal congestion, sore throat Cardiovascular: No chest pain, palpitations Respiratory:  No shortness of breath at rest or with exertion.   No wheezes GastrointestinaI: No nausea, vomiting, diarrhea, abdominal pain, fecal incontinence Genitourinary:  No dysuria, urinary retention or frequency.  No nocturia. Musculoskeletal:  No neck pain, back pain Integumentary: No rash, pruritus, skin lesions Neurological: as above Psychiatric: No depression at this time.  No anxiety Endocrine: No palpitations, diaphoresis, change in appetite, change in weigh or increased thirst Hematologic/Lymphatic:  No anemia, purpura, petechiae. Allergic/Immunologic: No itchy/runny eyes, nasal congestion, recent allergic reactions, rashes  ALLERGIES: Allergies  Allergen Reactions   Morphine Other (See Comments)    Patient states doesn't react well, he knocked out    HOME MEDICATIONS:  Current Outpatient Medications:    levETIRAcetam  (KEPPRA  XR) 750 MG 24 hr tablet, Take 2 tablets (1,500 mg total) by mouth daily., Disp: 180 tablet, Rfl: 4  PAST MEDICAL HISTORY: Past Medical History:  Diagnosis Date   Chicken pox    Kidney stones    OSA (obstructive sleep apnea)    per sleep study 8 or 12/2016 Sleep [facility]    Seizures (HCC)     PAST SURGICAL HISTORY: Past Surgical History:  Procedure Laterality Date   Meningioma Left 2020   brain   TONSILLECTOMY AND ADENOIDECTOMY     VASECTOMY      FAMILY HISTORY: Family History  Problem Relation Age of Onset   Heart disease Maternal Grandfather    Colon cancer Neg Hx    Prostate cancer Neg Hx    Stroke Neg Hx    Seizures Neg Hx    Migraines Neg  Hx    Sleep apnea Neg Hx     SOCIAL HISTORY:  Social History   Socioeconomic History   Marital status: Married    Spouse name: Calvin Mcbride   Number of children: Not on file   Years of education: Not on file   Highest education level: Not on file  Occupational History   Occupation: Full time  Tobacco Use   Smoking status: Never    Passive exposure: Never   Smokeless tobacco: Never  Vaping Use   Vaping status: Never Used  Substance and Sexual Activity   Alcohol use: Yes    Comment: Occasional   Drug use: No   Sexual activity: Yes  Other Topics Concern   Not on file  Social History Narrative   Lives with wife and 2 kids   Right Handed  No caffeine - decaf only    Social Drivers of Corporate Investment Banker Strain: Not on file  Food Insecurity: Not on file  Transportation Needs: Not on file  Physical Activity: Not on file  Stress: Not on file  Social Connections: Not on file  Intimate Partner Violence: Not on file     PHYSICAL EXAM  Vitals:   03/19/24 1501  BP: 116/79  Pulse: 73  Weight: 222 lb 6.4 oz (100.9 kg)  Height: 5' 11 (1.803 m)   Body mass index is 31.02 kg/m.  General: The patient is well-developed and well-nourished and in no acute distress  HEENT: He has evidence of the bifrontal craniotomy.  Sclera are anicteric.    Skin: Extremities are without rash or  edema.    Neurologic Exam  Mental status: The patient is alert and oriented x 3 at the time of the examination. The patient has apparent normal recent and remote memory, with an apparently normal attention span and concentration ability.   Speech is normal.  Cranial nerves: Extraocular movements are full. Pupils are equal, round, and reactive to light and accomodation.  Facial strength was normal.  No obvious hearing deficits are noted.  Motor:  Muscle bulk is normal.   Tone is normal. Strength is  5 / 5 in all 4 extremities.   Coordination: Cerebellar testing reveals good  finger-nose-finger and heel-to-shin bilaterally.  Gait and station: Station is normal.   Gait is normal. Tandem gait is normal. Romberg is negative.        DIAGNOSTIC DATA (LABS, IMAGING, TESTING) - I reviewed patient records, labs, notes, testing and imaging myself where available.  Lab Results  Component Value Date   WBC 8.1 03/15/2022   HGB 15.6 03/15/2022   HCT 45.2 03/15/2022   MCV 86.1 03/15/2022   PLT 248 03/15/2022      Component Value Date/Time   NA 138 03/15/2022 2145   K 3.6 03/15/2022 2145   CL 101 03/15/2022 2145   CO2 23 03/15/2022 2145   GLUCOSE 106 (H) 03/15/2022 2145   BUN 17 03/15/2022 2145   CREATININE 1.11 03/15/2022 2145   CALCIUM 9.4 03/15/2022 2145   PROT 7.7 03/18/2020 1036   ALBUMIN 4.9 03/18/2020 1036   AST 30 03/18/2020 1036   ALT 51 03/18/2020 1036   ALKPHOS 74 03/18/2020 1036   BILITOT 0.7 03/18/2020 1036   GFRNONAA >60 03/15/2022 2145   Lab Results  Component Value Date   CHOL 228 (H) 03/18/2020   HDL 43.40 03/18/2020   LDLCALC 122 (H) 11/07/2016   LDLDIRECT 166.0 03/18/2020   TRIG 252.0 (H) 03/18/2020   CHOLHDL 5 03/18/2020   Lab Results  Component Value Date   HGBA1C 5.5 03/18/2020   No results found for: VITAMINB12 Lab Results  Component Value Date   TSH 2.96 03/18/2020       ASSESSMENT AND PLAN  Generalized tonic-clonic seizure (HCC)  History of resection of meningioma  1.  Advised to continue Keppra  XR 1500 mg daily at this time 2. Will get opinion of epileptologist Dr. Gregg re: ongoing need of ASM and cause of seizure. Suspect seizures in setting of frontal meningioma but per patient, neurosurgeon does not believe this is the cause. He will be notified via MyChart re: Dr. Janean input 3.  Reports routine lab work through work which has been normal 3.   Return in 1 year or sooner if there are new or worsening neurologic symptoms  I personally spent a total of 40 minutes in the care of the patient  today including preparing to see the patient, getting/reviewing separately obtained history, performing a medically appropriate exam/evaluation, counseling and educating, placing orders, referring and communicating with other health care professionals, and documenting clinical information in the EHR.  Calvin Mcbride Bogaert, AGNP-BC  [facility] [address] [place], [place] [address]  Phone [phone] Fax [phone] Note: This document was prepared with digital dictation and possible smart phrase technology. Any transcriptional errors that result from this process are unintentional.

## 2024-03-26 ENCOUNTER — Encounter: Payer: Self-pay | Admitting: Adult Health

## 2025-03-27 ENCOUNTER — Ambulatory Visit: Admitting: Neurology
